# Patient Record
Sex: Female | Born: 1955 | Race: White | Hispanic: No | Marital: Married | Smoking: Never smoker
Health system: Southern US, Community
[De-identification: ages and names within clinical notes are randomized; demographics above are authoritative.]

## PROBLEM LIST (undated history)

## (undated) DIAGNOSIS — K219 Gastro-esophageal reflux disease without esophagitis: Secondary | ICD-10-CM

## (undated) DIAGNOSIS — F329 Major depressive disorder, single episode, unspecified: Secondary | ICD-10-CM

## (undated) DIAGNOSIS — S060X9A Concussion with loss of consciousness of unspecified duration, initial encounter: Secondary | ICD-10-CM

## (undated) DIAGNOSIS — S060XAA Concussion with loss of consciousness status unknown, initial encounter: Secondary | ICD-10-CM

## (undated) DIAGNOSIS — R011 Cardiac murmur, unspecified: Secondary | ICD-10-CM

## (undated) DIAGNOSIS — M1990 Unspecified osteoarthritis, unspecified site: Secondary | ICD-10-CM

## (undated) DIAGNOSIS — F32A Depression, unspecified: Secondary | ICD-10-CM

## (undated) DIAGNOSIS — E7800 Pure hypercholesterolemia, unspecified: Secondary | ICD-10-CM

## (undated) DIAGNOSIS — N809 Endometriosis, unspecified: Secondary | ICD-10-CM

## (undated) HISTORY — PX: THUMB AMPUTATION: SHX804

## (undated) HISTORY — PX: KNEE ARTHROPLASTY: SHX992

## (undated) HISTORY — PX: OTHER SURGICAL HISTORY: SHX169

## (undated) HISTORY — PX: DIAGNOSTIC LAPAROSCOPY: SUR761

## (undated) HISTORY — PX: TONSILLECTOMY: SUR1361

## (undated) HISTORY — DX: Depression, unspecified: F32.A

## (undated) HISTORY — PX: ABDOMINAL HYSTERECTOMY: SHX81

## (undated) HISTORY — PX: BUNIONECTOMY: SHX129

## (undated) HISTORY — DX: Gastro-esophageal reflux disease without esophagitis: K21.9

## (undated) HISTORY — DX: Pure hypercholesterolemia, unspecified: E78.00

## (undated) HISTORY — DX: Major depressive disorder, single episode, unspecified: F32.9

---

## 2008-08-13 ENCOUNTER — Emergency Department (HOSPITAL_COMMUNITY): Admission: EM | Admit: 2008-08-13 | Discharge: 2008-08-13 | Payer: Self-pay | Admitting: Emergency Medicine

## 2008-08-14 ENCOUNTER — Ambulatory Visit (HOSPITAL_BASED_OUTPATIENT_CLINIC_OR_DEPARTMENT_OTHER): Admission: RE | Admit: 2008-08-14 | Discharge: 2008-08-15 | Payer: Self-pay | Admitting: *Deleted

## 2008-10-23 ENCOUNTER — Encounter: Payer: Self-pay | Admitting: Cardiology

## 2009-06-15 ENCOUNTER — Emergency Department (HOSPITAL_COMMUNITY): Admission: EM | Admit: 2009-06-15 | Discharge: 2009-06-15 | Payer: Self-pay | Admitting: Emergency Medicine

## 2010-06-08 ENCOUNTER — Emergency Department (HOSPITAL_COMMUNITY): Admission: EM | Admit: 2010-06-08 | Discharge: 2010-06-08 | Payer: Self-pay | Admitting: Emergency Medicine

## 2011-01-28 NOTE — Op Note (Signed)
NAMEBRAYLEY, Renee Klein                  ACCOUNT NO.:  [id number]   MEDICAL RECORD NO.:  [id number]          PATIENT TYPE:  AMB   LOCATION:  DSC                          FACILITY:  [facility]   PHYSICIAN:  Lowell Bouton, M.D.DATE OF BIRTH:  [birth year]   DATE OF PROCEDURE:  08/14/2008  DATE OF DISCHARGE:                               OPERATIVE REPORT   PREOPERATIVE DIAGNOSIS:  Osteomyelitis distal phalanx left thumb.   POSTOPERATIVE DIAGNOSIS:  Osteomyelitis distal phalanx left thumb.   PROCEDURE:  Incision and drainage left thumb with repair of nail bed.   SURGEON:  Lowell Bouton, MD   ANESTHESIA:  General.   OPERATIVE FINDINGS:  The patient had gross purulent material under the  nail that had burst through the nail bed from the distal phalanx.  The  distal phalanx had eroded in the distal third and there was foul  smelling material in the pulp.   DESCRIPTION OF PROCEDURE:  Under general anesthesia with a tourniquet on  the left arm, the left hand was prepped and draped in the usual fashion  and after elevating the limb, the tourniquet was inflated to 250 mmHg.  A Freer elevator was used to remove the nail plate and gross purulent  material was obtained.  It appeared that the purulence had pushed  through the nail bed.  Cultures were sent to the lab.  The area that it  had pushed through the nail bed was spread open and more gross pus was  obtained.  A 15 blade was used to extend from the nail bed to the pulp  longitudinally and purulent material was debrided.  The rongeur and  curette was used to curette the distal phalanx which had a large hole in  it.  The wound was then copiously irrigated with saline using a 20-mL  syringe and 18-gauge needle.  The nail bed was repaired with a 5-0  chromic.  The pulp tissue was left open and packed with iodoform  packing.  Sterile dressings were applied.  Marcaine 0.50% digital block  was inserted for pain control.  The  tourniquet was released and the  patient went to the recovery room awake in stable in good condition.      Lowell Bouton, M.D.  Electronically Signed     EMM/MEDQ  D:  08/14/2008  T:  08/15/2008  Job:  [id number]

## 2011-06-17 LAB — ANAEROBIC CULTURE

## 2011-06-17 LAB — FUNGUS CULTURE W SMEAR

## 2011-06-17 LAB — AFB CULTURE WITH SMEAR (NOT AT ARMC)

## 2011-06-17 LAB — WOUND CULTURE: Gram Stain: NONE SEEN

## 2012-07-15 ENCOUNTER — Emergency Department (HOSPITAL_COMMUNITY)
Admission: EM | Admit: 2012-07-15 | Discharge: 2012-07-15 | Disposition: A | Discharge status: Home / Self Care | Payer: BC Managed Care – PPO | Attending: Emergency Medicine | Admitting: Emergency Medicine

## 2012-07-15 ENCOUNTER — Encounter (HOSPITAL_COMMUNITY): Payer: Self-pay

## 2012-07-15 ENCOUNTER — Emergency Department (HOSPITAL_COMMUNITY): Payer: BC Managed Care – PPO

## 2012-07-15 DIAGNOSIS — E7800 Pure hypercholesterolemia, unspecified: Secondary | ICD-10-CM | POA: Insufficient documentation

## 2012-07-15 DIAGNOSIS — Z90710 Acquired absence of both cervix and uterus: Secondary | ICD-10-CM | POA: Insufficient documentation

## 2012-07-15 DIAGNOSIS — F3289 Other specified depressive episodes: Secondary | ICD-10-CM | POA: Insufficient documentation

## 2012-07-15 DIAGNOSIS — R1031 Right lower quadrant pain: Secondary | ICD-10-CM | POA: Insufficient documentation

## 2012-07-15 DIAGNOSIS — K219 Gastro-esophageal reflux disease without esophagitis: Secondary | ICD-10-CM | POA: Insufficient documentation

## 2012-07-15 DIAGNOSIS — J45909 Unspecified asthma, uncomplicated: Secondary | ICD-10-CM | POA: Insufficient documentation

## 2012-07-15 DIAGNOSIS — F329 Major depressive disorder, single episode, unspecified: Secondary | ICD-10-CM | POA: Insufficient documentation

## 2012-07-15 DIAGNOSIS — Z8742 Personal history of other diseases of the female genital tract: Secondary | ICD-10-CM | POA: Insufficient documentation

## 2012-07-15 DIAGNOSIS — R109 Unspecified abdominal pain: Secondary | ICD-10-CM

## 2012-07-15 DIAGNOSIS — Z79899 Other long term (current) drug therapy: Secondary | ICD-10-CM | POA: Insufficient documentation

## 2012-07-15 LAB — URINALYSIS, ROUTINE W REFLEX MICROSCOPIC
Bilirubin Urine: NEGATIVE
Ketones, ur: NEGATIVE mg/dL
Leukocytes, UA: NEGATIVE
Nitrite: NEGATIVE
Protein, ur: NEGATIVE mg/dL
Urobilinogen, UA: 0.2 mg/dL (ref 0.0–1.0)
pH: 7.5 (ref 5.0–8.0)

## 2012-07-15 LAB — CBC WITH DIFFERENTIAL/PLATELET
Eosinophils Absolute: 0.3 10*3/uL (ref 0.0–0.7)
Lymphocytes Relative: 31 % (ref 12–46)
MCHC: 35.2 g/dL (ref 30.0–36.0)
Monocytes Absolute: 0.6 10*3/uL (ref 0.1–1.0)
Neutro Abs: 4 10*3/uL (ref 1.7–7.7)
Neutrophils Relative %: 57 % (ref 43–77)
RBC: 4.73 MIL/uL (ref 3.87–5.11)
WBC: 7 10*3/uL (ref 4.0–10.5)

## 2012-07-15 LAB — BASIC METABOLIC PANEL
BUN: 13 mg/dL (ref 6–23)
Chloride: 102 mEq/L (ref 96–112)
Glucose, Bld: 92 mg/dL (ref 70–99)
Sodium: 139 mEq/L (ref 135–145)

## 2012-07-15 MED ORDER — IOHEXOL 300 MG/ML  SOLN: 20.0000 mL | INTRAMUSCULAR | Status: DC

## 2012-07-15 MED ORDER — NAPROXEN 500 MG PO TABS: 500.0000 mg | ORAL_TABLET | Freq: Two times a day (BID) | ORAL | Status: DC

## 2012-07-15 MED ORDER — IOHEXOL 300 MG/ML  SOLN
100.0000 mL | Freq: Once | INTRAMUSCULAR | Status: AC | PRN
  Administered 2012-07-15: 100 mL via INTRAVENOUS

## 2012-07-15 NOTE — ED Notes (Signed)
Patient presents with RLQ abdominal pain since this past Monday with radiation around to her right flank.  Patient denies urinary symptoms of dysuria, hematuria, frequency or urgency. Patient has no rebound tenderness upon palpation.  Last bowel movement today.

## 2012-07-15 NOTE — ED Notes (Signed)
Pt denies sob, dizziness

## 2012-07-15 NOTE — ED Notes (Signed)
Called CT to inform that patient is finished with contrast

## 2012-07-15 NOTE — ED Notes (Signed)
Pt is back in room from CT

## 2012-07-15 NOTE — ED Provider Notes (Signed)
History     CSN: [id number]  Arrival date & time 07/15/12  1884   First MD Initiated Contact with Patient 07/15/12 1907      Chief Complaint  Patient presents with  . Abdominal Pain    (Consider location/radiation/quality/duration/timing/severity/associated sxs/prior treatment) HPI Comments: This is a 56 year old female, who presents to the emergency department with chief complaint of right lower quadrant abdominal pain x3 days. The patient has had an abdominal hysterectomy, as well as two laparoscopic procedures for endometriosis. There are no peritoneal signs, negative Murphy's sign or right upper quadrant tenderness. Patient endorses pain in the right lower quadrant with palpation of left lower quadrant. She also endorses pain and rebound tenderness at McBurney's point. She is in 6/10 pain. The patient refuses pain medication at this time. The pain is associated with nausea. Denies chest pain, shortness of breath, vomiting, or diarrhea, and constipation. Also denies vaginal discharge, and it dysuria.  The history is provided by the patient. No language interpreter was used.    Past Medical History  Diagnosis Date  . GERD (gastroesophageal reflux disease)   . Asthma   . Depression   . Hypercholesterolemia     Past Surgical History  Procedure Date  . Bone scraping left thumb   . Hysterotomy     Family History  Problem Relation Age of Onset  . Heart attack Mother   . Heart attack Father   . Other Father     heart valve surgery  . Seizures Sister   . Breast cancer Sister   . Hypertension Sister     History  Substance Use Topics  . Smoking status: Never Smoker   . Smokeless tobacco: Not on file  . Alcohol Use: 0.0 - 0.5 oz/week    0-1 drink(s) per week     rarely drinks alcohol    OB History    Grav Para Term Preterm Abortions TAB SAB Ect Mult Living                  Review of Systems  Constitutional: Negative for fever.  Cardiovascular: Negative for chest  pain.  Gastrointestinal: Positive for nausea and abdominal pain. Negative for vomiting, diarrhea and constipation.  Genitourinary: Negative for dysuria and vaginal discharge.  All other systems reviewed and are negative.    Allergies  Codeine; Levofloxacin; Lipitor; and Statins  Home Medications   Current Outpatient Rx  Name Route Sig Dispense Refill  . ALBUTEROL SULFATE HFA 108 (90 BASE) MCG/ACT IN AERS Inhalation Inhale 2 puffs into the lungs every 6 (six) hours as needed. For shortness of breath    . ALVESCO IN Inhalation Inhale 2 Inhalers into the lungs every evening.     Marland Kitchen MONTELUKAST SODIUM 5 MG PO CHEW Oral Chew 5 mg by mouth at bedtime.    Marland Kitchen PANTOPRAZOLE SODIUM 40 MG PO TBEC Oral Take 40 mg by mouth every evening.     Marland Kitchen PAROXETINE HCL 20 MG PO TABS Oral Take 20 mg by mouth every evening.       BP 145/80  Pulse 77  Temp 97.9 F (36.6 C) (Oral)  Resp 18  SpO2 95%  Physical Exam  Nursing note and vitals reviewed. Constitutional: She is oriented to person, place, and time. She appears well-developed and well-nourished.  HENT:  Head: Normocephalic and atraumatic.  Eyes: Conjunctivae normal and EOM are normal. Pupils are equal, round, and reactive to light.  Neck: Normal range of motion. Neck supple.  Cardiovascular: Normal rate,  regular rhythm and normal heart sounds.   Pulmonary/Chest: Effort normal and breath sounds normal.  Abdominal:       Positive McBurney point tenderness, positive Rovsing sign, no right upper quadrant or left upper quadrant tenderness. The abdomen is soft and nondistended.  Musculoskeletal: Normal range of motion.  Neurological: She is alert and oriented to person, place, and time.  Skin: Skin is warm and dry.  Psychiatric: She has a normal mood and affect. Her behavior is normal. Judgment and thought content normal.    ED Course  Procedures (including critical care time)   Labs Reviewed  URINALYSIS, ROUTINE W REFLEX MICROSCOPIC  CBC WITH  DIFFERENTIAL  BASIC METABOLIC PANEL   No results found.   No diagnosis found.    MDM   This is a 56 year old female with right lower quadrant abdominal pain. I'm going to this patient to the CDU, to await her CT. Have discussed this patient with Felicie Morn, NP, who will resume care at this time. Her disposition is pending CT results, suspicious of appendicitis.       Roxy Horseman, PA-C 07/15/12 2114

## 2012-07-15 NOTE — ED Notes (Signed)
Patient transported to CT 

## 2012-07-15 NOTE — ED Provider Notes (Signed)
Patient in CDU pending completion of diagnostic testing in the evaluation of abdominal pain.  Lab and CT results reviewed, discussed with Dr. Bebe Shaggy and shared with patient.  Patient reports her pain began Monday evening after spending the afternoon moving furniture and pulling up carpet at her house.  She does not recall suffering a known injury.  Her RLQ discomfort worsens with activity such as walking as well as bending lifting her right leg.  Pain eases off during rest.  Suspect discomfort may be muscular in origin.  Will trial anti-inflammatory at home.  If no improvement or continued symptoms, patient to follow-up with her care provider.  Abdominal pain discharge instructions and precautions provided.  Jimmye Norman, NP 07/15/12 2312

## 2012-07-16 NOTE — ED Provider Notes (Signed)
Medical screening examination/treatment/procedure(s) were conducted as a shared visit with non-physician practitioner(s) and myself.  I personally evaluated the patient during the encounter  Pt with abd tenderness, recommended CT imaging.  Pt stable in the ED  Joya Gaskins, MD 07/16/12 1510

## 2012-07-16 NOTE — ED Provider Notes (Signed)
Medical screening examination/treatment/procedure(s) were conducted as a shared visit with non-physician practitioner(s) and myself.  I personally evaluated the patient during the encounter   Joya Gaskins, MD 07/16/12 [phone]

## 2014-02-21 ENCOUNTER — Other Ambulatory Visit: Payer: Self-pay | Admitting: Orthopaedic Surgery

## 2014-02-21 DIAGNOSIS — M25562 Pain in left knee: Secondary | ICD-10-CM

## 2014-02-23 ENCOUNTER — Ambulatory Visit
Admission: RE | Admit: 2014-02-23 | Discharge: 2014-02-23 | Disposition: A | Discharge status: Home / Self Care | Payer: BC Managed Care – PPO | Source: Ambulatory Visit | Attending: Orthopaedic Surgery | Admitting: Orthopaedic Surgery

## 2014-02-23 DIAGNOSIS — M25562 Pain in left knee: Secondary | ICD-10-CM

## 2016-10-09 ENCOUNTER — Telehealth (INDEPENDENT_AMBULATORY_CARE_PROVIDER_SITE_OTHER): Payer: Self-pay | Admitting: *Deleted

## 2016-10-09 NOTE — Telephone Encounter (Signed)
Pt called asking if she could get Synvisc injection. CB:[phone]

## 2016-10-09 NOTE — Telephone Encounter (Signed)
[place]'t [place], can you do me a favor and see if and what she has?

## 2016-10-14 NOTE — Telephone Encounter (Signed)
I called pt back to get insurance info. Left message to call back

## 2016-10-17 ENCOUNTER — Other Ambulatory Visit (INDEPENDENT_AMBULATORY_CARE_PROVIDER_SITE_OTHER): Payer: Self-pay

## 2016-10-21 NOTE — Telephone Encounter (Signed)
Her Monovisc was approved, can you call her to make appt for this please?

## 2016-10-22 NOTE — Telephone Encounter (Signed)
Patient scheduled for an appointment on 09/22/16 for injection

## 2016-10-23 ENCOUNTER — Ambulatory Visit (INDEPENDENT_AMBULATORY_CARE_PROVIDER_SITE_OTHER): Payer: Managed Care, Other (non HMO) | Admitting: Physician Assistant

## 2016-10-23 DIAGNOSIS — M1712 Unilateral primary osteoarthritis, left knee: Secondary | ICD-10-CM

## 2016-10-23 DIAGNOSIS — M79642 Pain in left hand: Secondary | ICD-10-CM

## 2016-10-23 DIAGNOSIS — M79641 Pain in right hand: Secondary | ICD-10-CM | POA: Diagnosis not present

## 2016-10-23 MED ORDER — HYALURONAN 88 MG/4ML IX SOSY
88.0000 mg | PREFILLED_SYRINGE | INTRA_ARTICULAR | Status: AC | PRN
  Administered 2016-10-23: 88 mg via INTRA_ARTICULAR

## 2016-10-23 NOTE — Progress Notes (Signed)
Office Visit Note   Patient: Renee Klein           Date of Birth: 07/19/1956           MRN: [id number] Visit Date: 10/23/2016              Requested by: Barbette Hair, MD [address] [place] [address] PCP: Elise Benne, MD   Assessment & Plan: Visit Diagnoses: No diagnosis found.  Plan: We'll have her undergo EMG nerve conduction studies to rule out carpal tunnel syndrome bilateral hands have her follow with Dr. Magnus Ivan after the studies to discuss further treatment. Regards to her left knee she understands she can not have another Monovisc injection for another 6 months.  Follow-Up Instructions: Return for after EMG/ [place] studies with Dr. Magnus Ivan.   Orders:  No orders of the defined types were placed in this encounter.  No orders of the defined types were placed in this encounter.     Procedures: Large Joint Inj Date/Time: 10/23/2016 2:10 PM Performed by: Kirtland Bouchard Authorized by: Kirtland Bouchard   Location:  Knee Site:  L knee Needle Size:  22 G Needle Length:  1.5 inches Approach:  Anterolateral Ultrasound Guidance: No   Fluoroscopic Guidance: No   Arthrogram: No   Medications:  88 mg Hyaluronan 88 MG/4ML Aspiration Attempted: No   Patient tolerance:  Patient tolerated the procedure well with no immediate complications      Clinical Data: No additional findings.   Subjective: Chief Complaint  Patient presents with  . Left Knee - Follow-up    HPI Mrs. toe comes in today for left knee model disc injection. 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 No new injury to the knee. She does have a new complaint of bilateral hands going numb and pain in the hands she does have to shake her hands whenever she awakens. Also the hands go numb whenever she is driving. She does a lot of keying daily. Review of Systems   Objective: Vital Signs: There were no vitals taken for this visit.  Physical Exam    Constitutional: She is oriented to person, place, and time. She appears well-developed and well-nourished. No distress.  Pulmonary/Chest: Effort normal.  Neurological: She is alert and oriented to person, place, and time.  Psychiatric: She has a normal mood and affect. Her behavior is normal.    Ortho Exam Left knee she has good range of motion of the left knee today without pain. No rashes skin lesions ulcerations erythema or ecchymosis. Next bilateral hands she has full sensation to bilateral hands. Full motor of both hands. Radial pulses are 2+ bilaterally and equal symmetric. Positive compression test over the right median nerve. Negative Tinel's bilaterally over the median nerves. Specialty Comments:  No specialty comments available.  Imaging: No results found.   PMFS History: There are no active problems to display for this patient.  Past Medical History:  Diagnosis Date  . Asthma   . Depression   . GERD (gastroesophageal reflux disease)   . Hypercholesterolemia     Family History  Problem Relation Age of Onset  . Heart attack Mother   . Heart attack Father   . Other Father     heart valve surgery  . Seizures Sister   . Breast cancer Sister   . Hypertension Sister     Past Surgical History:  Procedure Laterality Date  . bone scraping left  thumb    . HYSTEROTOMY     Social History   Occupational History  . clinical researcher    Social History Main Topics  . Smoking status: Never Smoker  . Smokeless tobacco: Not on file  . Alcohol use 0.0 - 0.5 oz/week    0 - 1 drink(s) per week     Comment: rarely drinks alcohol  . Drug use: Unknown  . Sexual activity: Not on file

## 2016-10-31 ENCOUNTER — Encounter (INDEPENDENT_AMBULATORY_CARE_PROVIDER_SITE_OTHER): Payer: Self-pay | Admitting: Physical Medicine and Rehabilitation

## 2016-10-31 ENCOUNTER — Ambulatory Visit (INDEPENDENT_AMBULATORY_CARE_PROVIDER_SITE_OTHER): Payer: Managed Care, Other (non HMO) | Admitting: Physical Medicine and Rehabilitation

## 2016-10-31 DIAGNOSIS — R202 Paresthesia of skin: Secondary | ICD-10-CM | POA: Diagnosis not present

## 2016-10-31 DIAGNOSIS — R200 Anesthesia of skin: Secondary | ICD-10-CM

## 2016-10-31 NOTE — Progress Notes (Addendum)
Renee Klein - 61 y.o. female MRN [id number]  Date of birth: [birth year]  Office Visit Note: Visit Date: 10/31/2016 PCP: Elise Benne, MD Referred by: Elise Benne IV*  Subjective: Chief Complaint  Patient presents with  . Left Hand - Numbness, Pain  . Right Hand - Pain, Numbness   HPI: Renee Klein is a 61 year old right-hand dominant female who is followed by Dr. Magnus Ivan and Rexene Edison, PA with complaints of worsening chronic bilateral hand pain and numbness into fingers. She reports worsening symptoms on the right more than left. States when it gets really bad it will radiate up to elbow on both sides.She  Reports having  to shake her hands whenever she awakens. Also the hands go numb whenever she is driving. She does a lot of keying daily. She has not had prior electrodiagnostic studies.    ROS Otherwise per HPI.  Assessment & Plan: Visit Diagnoses:  1. Numbness and tingling     Plan: No additional findings.  Impression:  The above electrodiagnostic study is ABNORMAL and reveals evidence of a mild bilateral median nerve entrapment at the wrist (carpal tunnel syndrome) affecting sensory components.   There is no significant electrodiagnostic evidence of any other focal nerve entrapment, brachial plexopathy or cervical radiculopathy.    As you know, this particular electrodiagnostic study cannot rule out chemical radiculitis or sensory only radiculopathy.  Recommendations: 1.  Follow-up with referring physician. 2.  Continue current management of symptoms. 3.  Suggest/Continue use of resting splint at night-time and as needed during the day. Consider carpal tunnel injection.     Meds & Orders: No orders of the defined types were placed in this encounter.   Orders Placed This Encounter  Procedures  . NCV with EMG (electromyography)    Follow-up: Return for Scheduled follow-up with Rexene Edison, PA.   Procedures: No procedures performed  EMG & NCV  Findings: Evaluation of the left median (across palm) sensory and the right median (across palm) sensory nerves showed prolonged distal peak latency (Wrist, L59.0, R59.0 ms) and prolonged distal peak latency (Palm, L2.5, R2.8 ms).  All remaining nerves (as indicated in the following tables) were within normal limits.  Left vs. Right side comparison data for the ulnar motor nerve indicates abnormal L-R velocity difference (A Elbow-B Elbow, 20 m/s).  All remaining left vs. right side differences were within normal limits.    All examined muscles (as indicated in the following table) showed no evidence of electrical instability.    Impression: The above electrodiagnostic study is ABNORMAL and reveals evidence of a mild bilateral median nerve entrapment at the wrist (carpal tunnel syndrome) affecting sensory components.   There is no significant electrodiagnostic evidence of any other focal nerve entrapment, brachial plexopathy or cervical radiculopathy.  As you know, this particular electrodiagnostic study cannot rule out chemical radiculitis or sensory only radiculopathy.  Recommendations: 1.  Follow-up with referring physician. 2.  Continue current management of symptoms. 3.  Continue use of resting splint at night-time and as needed during the day. Consider carpal tunnel injection.  Nerve Conduction Studies Anti Sensory Summary Table   Stim Site NR Peak (ms) Norm Peak (ms) P-T Amp (V) Norm P-T Amp Site1 Site2 Delta-P (ms) Dist (cm) Vel (m/s) Norm Vel (m/s)  Left Median Acr Palm Anti Sensory (2nd Digit)  31.9C  Wrist    *3.9 <3.6 43.0 >10 Wrist Palm 1.4 0.0    Palm    *2.5 <2.0 2.5  Right Median Acr Palm Anti Sensory (2nd Digit)  32.1C  Wrist    *3.9 <3.6 21.1 >10 Wrist Palm 1.1 0.0    Palm    *2.8 <2.0 8.9         Left Radial Anti Sensory (Base 1st Digit)  31.7C  Wrist    2.1 <3.1 32.0  Wrist Base 1st Digit 2.1 0.0    Right Radial Anti Sensory (Base 1st Digit)  32.8C  Wrist     2.3 <3.1 22.4  Wrist Base 1st Digit 2.3 0.0    Left Ulnar Anti Sensory (5th Digit)  32C  Wrist    3.4 <3.7 22.4 >15.0 Wrist 5th Digit 3.4 14.0 41 >38  Right Ulnar Anti Sensory (5th Digit)  32.3C  Wrist    3.3 <3.7 30.2 >15.0 Wrist 5th Digit 3.3 14.0 42 >38   Motor Summary Table   Stim Site NR Onset (ms) Norm Onset (ms) O-P Amp (mV) Norm O-P Amp Site1 Site2 Delta-0 (ms) Dist (cm) Vel (m/s) Norm Vel (m/s)  Left Median Motor (Abd Poll Brev)  31.8C  Wrist    3.8 <4.2 7.2 >5 Elbow Wrist 3.6 18.5 51 >50  Elbow    7.4  2.5         Right Median Motor (Abd Poll Brev)  32.6C  Wrist    3.9 <4.2 7.6 >5 Elbow Wrist 3.7 19.0 51 >50  Elbow    7.6  2.8         Left Ulnar Motor (Abd Dig Min)  31.8C  Wrist    2.8 <4.2 11.8 >3 B Elbow Wrist 3.0 17.5 58 >53  B Elbow    5.8  11.6  A Elbow B Elbow 1.2 9.0 75 >53  A Elbow    7.0  8.8         Right Ulnar Motor (Abd Dig Min)  32.6C  Wrist    2.8 <4.2 11.6 >3 B Elbow Wrist 2.8 18.0 64 >53  B Elbow    5.6  11.9  A Elbow B Elbow 1.0 9.5 95 >53  A Elbow    6.6  7.8          EMG   Side Muscle Nerve Root Ins Act Fibs Psw Amp Dur Poly Recrt Int Dennie Bible Comment  Right Abd Poll Brev Median C8-T1 Nml Nml Nml Nml Nml 0 Nml Nml   Right 1stDorInt Ulnar C8-T1 Nml Nml Nml Nml Nml 0 Nml Nml   Right PronatorTeres Median C6-7 Nml Nml Nml Nml Nml 0 Nml Nml     Nerve Conduction Studies Anti Sensory Left/Right Comparison   Stim Site L Lat (ms) R Lat (ms) L-R Lat (ms) L Amp (V) R Amp (V) L-R Amp (%) Site1 Site2 L Vel (m/s) R Vel (m/s) L-R Vel (m/s)  Median Acr Palm Anti Sensory (2nd Digit)  31.9C  Wrist *3.9 *3.9 0.0 43.0 21.1 50.9 Wrist Palm     Palm *2.5 *2.8 0.3 2.5 8.9 71.9       Radial Anti Sensory (Base 1st Digit)  31.7C  Wrist 2.1 2.3 0.2 32.0 22.4 30.0 Wrist Base 1st Digit     Ulnar Anti Sensory (5th Digit)  32C  Wrist 3.4 3.3 0.1 22.4 30.2 25.8 Wrist 5th Digit 41 42 1   Motor Left/Right Comparison   Stim Site L Lat (ms) R Lat (ms) L-R Lat (ms) L Amp  (mV) R Amp (mV) L-R Amp (%) Site1 Site2 L Vel (m/s) R Vel (m/s) L-R Vel (  m/s)  Median Motor (Abd Poll Brev)  31.8C  Wrist 3.8 3.9 0.1 7.2 7.6 5.3 Elbow Wrist 51 51 0  Elbow 7.4 7.6 0.2 2.5 2.8 10.7       Ulnar Motor (Abd Dig Min)  31.8C  Wrist 2.8 2.8 0.0 11.8 11.6 1.7 B Elbow Wrist 58 64 6  B Elbow 5.8 5.6 0.2 11.6 11.9 2.5 A Elbow B Elbow 75 95 *20  A Elbow 7.0 6.6 0.4 8.8 7.8 11.4             Clinical History: No specialty comments available.  She reports that she has never smoked. She does not have any smokeless tobacco history on file. No results for input(s): HGBA1C, LABURIC in the last 8760 hours.  Objective:  VS:  HT:    WT:   BMI:     BP:   HR: bpm  TEMP: ( )  RESP:  Physical Exam  Ortho Exam Imaging: No results found.  Past Medical/Family/Surgical/Social History: Medications & Allergies reviewed per EMR There are no active problems to display for this patient.  Past Medical History:  Diagnosis Date  . Asthma   . Depression   . GERD (gastroesophageal reflux disease)   . Hypercholesterolemia    Family History  Problem Relation Age of Onset  . Heart attack Mother   . Heart attack Father   . Other Father     heart valve surgery  . Seizures Sister   . Breast cancer Sister   . Hypertension Sister    Past Surgical History:  Procedure Laterality Date  . bone scraping left thumb    . HYSTEROTOMY     Social History   Occupational History  . clinical researcher    Social History Main Topics  . Smoking status: Never Smoker  . Smokeless tobacco: Not on file  . Alcohol use 0.0 - 0.5 oz/week    0 - 1 drink(s) per week     Comment: rarely drinks alcohol  . Drug use: Unknown  . Sexual activity: Not on file

## 2016-11-03 NOTE — Procedures (Signed)
EMG & NCV Findings: Evaluation of the left median (across palm) sensory and the right median (across palm) sensory nerves showed prolonged distal peak latency (Wrist, L3.9, R3.9 ms) and prolonged distal peak latency (Palm, L2.5, R2.8 ms).  All remaining nerves (as indicated in the following tables) were within normal limits.  Left vs. Right side comparison data for the ulnar motor nerve indicates abnormal L-R velocity difference (A Elbow-B Elbow, 20 m/s).  All remaining left vs. right side differences were within normal limits.    All examined muscles (as indicated in the following table) showed no evidence of electrical instability.    Impression: The above electrodiagnostic study is ABNORMAL and reveals evidence of a mild bilateral median nerve entrapment at the wrist (carpal tunnel syndrome) affecting sensory components.   There is no significant electrodiagnostic evidence of any other focal nerve entrapment, brachial plexopathy or cervical radiculopathy.  As you know, this particular electrodiagnostic study cannot rule out chemical radiculitis or sensory only radiculopathy.  Recommendations: 1.  Follow-up with referring physician. 2.  Continue current management of symptoms. 3.  Continue use of resting splint at night-time and as needed during the day. Consider carpal tunnel injection.  Nerve Conduction Studies Anti Sensory Summary Table   Stim Site NR Peak (ms) Norm Peak (ms) P-T Amp (V) Norm P-T Amp Site1 Site2 Delta-P (ms) Dist (cm) Vel (m/s) Norm Vel (m/s)  Left Median Acr Palm Anti Sensory (2nd Digit)  31.9C  Wrist    *3.9 <3.6 43.0 >10 Wrist Palm 1.4 0.0    Palm    *2.5 <2.0 2.5         Right Median Acr Palm Anti Sensory (2nd Digit)  32.1C  Wrist    *3.9 <3.6 21.1 >10 Wrist Palm 1.1 0.0    Palm    *2.8 <2.0 8.9         Left Radial Anti Sensory (Base 1st Digit)  31.7C  Wrist    2.1 <3.1 32.0  Wrist Base 1st Digit 2.1 0.0    Right Radial Anti Sensory (Base 1st Digit)  32.8C   Wrist    2.3 <3.1 22.4  Wrist Base 1st Digit 2.3 0.0    Left Ulnar Anti Sensory (5th Digit)  32C  Wrist    3.4 <3.7 22.4 >15.0 Wrist 5th Digit 3.4 14.0 41 >38  Right Ulnar Anti Sensory (5th Digit)  32.3C  Wrist    3.3 <3.7 30.2 >15.0 Wrist 5th Digit 3.3 14.0 42 >38   Motor Summary Table   Stim Site NR Onset (ms) Norm Onset (ms) O-P Amp (mV) Norm O-P Amp Site1 Site2 Delta-0 (ms) Dist (cm) Vel (m/s) Norm Vel (m/s)  Left Median Motor (Abd Poll Brev)  31.8C  Wrist    3.8 <4.2 7.2 >5 Elbow Wrist 3.6 18.5 51 >50  Elbow    7.4  2.5         Right Median Motor (Abd Poll Brev)  32.6C  Wrist    3.9 <4.2 7.6 >5 Elbow Wrist 3.7 19.0 51 >50  Elbow    7.6  2.8         Left Ulnar Motor (Abd Dig Min)  31.8C  Wrist    2.8 <4.2 11.8 >3 B Elbow Wrist 3.0 17.5 58 >53  B Elbow    5.8  11.6  A Elbow B Elbow 1.2 9.0 75 >53  A Elbow    7.0  8.8         Right Ulnar Motor (Abd Dig  Min)  32.6C  Wrist    2.8 <4.2 11.6 >3 B Elbow Wrist 2.8 18.0 64 >53  B Elbow    5.6  11.9  A Elbow B Elbow 1.0 9.5 95 >53  A Elbow    6.6  7.8          EMG   Side Muscle Nerve Root Ins Act Fibs Psw Amp Dur Poly Recrt Int Dennie BiblePat Comment  Right Abd Poll Brev Median C8-T1 Nml Nml Nml Nml Nml 0 Nml Nml   Right 1stDorInt Ulnar C8-T1 Nml Nml Nml Nml Nml 0 Nml Nml   Right PronatorTeres Median C6-7 Nml Nml Nml Nml Nml 0 Nml Nml     Nerve Conduction Studies Anti Sensory Left/Right Comparison   Stim Site L Lat (ms) R Lat (ms) L-R Lat (ms) L Amp (V) R Amp (V) L-R Amp (%) Site1 Site2 L Vel (m/s) R Vel (m/s) L-R Vel (m/s)  Median Acr Palm Anti Sensory (2nd Digit)  31.9C  Wrist *3.9 *3.9 0.0 43.0 21.1 50.9 Wrist Palm     Palm *2.5 *2.8 0.3 2.5 8.9 71.9       Radial Anti Sensory (Base 1st Digit)  31.7C  Wrist 2.1 2.3 0.2 32.0 22.4 30.0 Wrist Base 1st Digit     Ulnar Anti Sensory (5th Digit)  32C  Wrist 3.4 3.3 0.1 22.4 30.2 25.8 Wrist 5th Digit 41 42 1   Motor Left/Right Comparison   Stim Site L Lat (ms) R Lat (ms) L-R Lat  (ms) L Amp (mV) R Amp (mV) L-R Amp (%) Site1 Site2 L Vel (m/s) R Vel (m/s) L-R Vel (m/s)  Median Motor (Abd Poll Brev)  31.8C  Wrist 3.8 3.9 0.1 7.2 7.6 5.3 Elbow Wrist 51 51 0  Elbow 7.4 7.6 0.2 2.5 2.8 10.7       Ulnar Motor (Abd Dig Min)  31.8C  Wrist 2.8 2.8 0.0 11.8 11.6 1.7 B Elbow Wrist 58 64 6  B Elbow 5.8 5.6 0.2 11.6 11.9 2.5 A Elbow B Elbow 75 95 *20  A Elbow 7.0 6.6 0.4 8.8 7.8 11.4

## 2016-11-10 ENCOUNTER — Ambulatory Visit (INDEPENDENT_AMBULATORY_CARE_PROVIDER_SITE_OTHER): Payer: Managed Care, Other (non HMO) | Admitting: Physician Assistant

## 2016-11-17 ENCOUNTER — Encounter (INDEPENDENT_AMBULATORY_CARE_PROVIDER_SITE_OTHER): Payer: Self-pay | Admitting: Physician Assistant

## 2016-11-17 ENCOUNTER — Ambulatory Visit (INDEPENDENT_AMBULATORY_CARE_PROVIDER_SITE_OTHER): Payer: Managed Care, Other (non HMO) | Admitting: Physician Assistant

## 2016-11-17 DIAGNOSIS — G5602 Carpal tunnel syndrome, left upper limb: Secondary | ICD-10-CM | POA: Diagnosis not present

## 2016-11-17 DIAGNOSIS — G5601 Carpal tunnel syndrome, right upper limb: Secondary | ICD-10-CM | POA: Diagnosis not present

## 2016-11-17 MED ORDER — METHYLPREDNISOLONE ACETATE 40 MG/ML IJ SUSP
40.0000 mg | INTRAMUSCULAR | Status: AC | PRN
  Administered 2016-11-17: 40 mg

## 2016-11-17 MED ORDER — LIDOCAINE HCL 1 % IJ SOLN
1.0000 mL | INTRAMUSCULAR | Status: AC | PRN
  Administered 2016-11-17: 1 mL

## 2016-11-17 NOTE — Progress Notes (Signed)
Office Visit Note   Patient: Renee Klein           Date of Birth: 01/13/1956           MRN: [id number] Visit Date: 11/17/2016              Requested by: Barbette Hair, MD [address], [place] [address] PCP: Elise Benne, MD   Assessment & Plan: Visit Diagnoses:  1. Carpal tunnel syndrome, left upper limb   2. Carpal tunnel syndrome, right upper limb     Plan: She will try vitamin B6 on her milligrams twice daily. 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 We'll see her back in a month check progress lack of. Questions encouraged and answered.  Follow-Up Instructions: Return in about 4 weeks (around 12/15/2016).   Orders:  Orders Placed This Encounter  Procedures  . Hand/Upper Extremity Injection/Arthrocentesis   No orders of the defined types were placed in this encounter.     Procedures: Hand/UE Inj Date/Time: 11/17/2016 9:42 AM Performed by: Kirtland Bouchard Authorized by: Kirtland Bouchard   Consent Given by:  Patient Indications:  Pain and therapeutic Condition: carpal tunnel   Needle Size:  25 G Approach:  Volar Ultrasound Guidance: No   Medications:  1 mL lidocaine 1 %; 40 mg methylPREDNISolone acetate 40 MG/ML Patient tolerance:  Patient tolerated the procedure well with no immediate complications     Clinical Data: No additional findings.   Subjective: Chief Complaint  Patient presents with  . Right Hand - Pain  . Left Hand - Pain    Patient returns to review EMG/NCV of bilateral upper extremities. She states that her symptoms are a little worse. She states that she did fall yesterday over a shoe string that became untied. She is taking aleve which helps with her pain when she doubles up on them.   She states that her right wrist is more symptomatic than the left. Has a lot of symptoms whenever she is working at the [employer].  She's had no real treatment for this numbness tingling in the hands. EMG study findings are reviewed with the patient. EMG'show abnormal study with significant electrodiagnostic evidence of bilateral median nerve entrapment which is mild.No focal nerve entrapment ,brachial plexopathy or cervical radiculopathy.  Review of Systems   Objective: Vital Signs: There were no vitals taken for this visit.  Physical Exam  Constitutional: She appears well-developed and well-nourished. No distress.    Ortho Exam Tenderness over the right wrist median nerve. Tenderness over the A1 pulleys at both middle fingers no active triggering. Radial pulses are intact bilaterally. No rashes skin lesions ulcerations bilateral hands. Specialty Comments:  No specialty comments available.  Imaging: No results found.   PMFS History: There are no active problems to display for this patient.  Past Medical History:  Diagnosis Date  . Asthma   . Depression   . GERD (gastroesophageal reflux disease)   . Hypercholesterolemia     Family History  Problem Relation Age of Onset  . Heart attack Mother   . Heart attack Father   . Other Father     heart valve surgery  . Seizures Sister   . Breast cancer Sister   . Hypertension Sister     Past Surgical History:  Procedure Laterality Date  . bone scraping left thumb    .  HYSTEROTOMY     Social History   Occupational History  . clinical researcher    Social History Main Topics  . Smoking status: Never Smoker  . Smokeless tobacco: Never Used  . Alcohol use 0.0 - 0.5 oz/week     Comment: rarely drinks alcohol  . Drug use: Unknown  . Sexual activity: Not on file

## 2016-12-24 ENCOUNTER — Ambulatory Visit (INDEPENDENT_AMBULATORY_CARE_PROVIDER_SITE_OTHER): Payer: Managed Care, Other (non HMO) | Admitting: Physician Assistant

## 2016-12-24 ENCOUNTER — Encounter (INDEPENDENT_AMBULATORY_CARE_PROVIDER_SITE_OTHER): Payer: Self-pay | Admitting: Physician Assistant

## 2016-12-24 DIAGNOSIS — G5603 Carpal tunnel syndrome, bilateral upper limbs: Secondary | ICD-10-CM

## 2016-12-24 NOTE — Progress Notes (Signed)
   Office Visit Note   Patient: Renee Klein           Date of Birth: 1956/02/04           MRN: [id number] Visit Date: 12/24/2016              Requested by: Barbette Hair, MD [address] [place] [address] PCP: Elise Benne, MD   Assessment & Plan: Visit Diagnoses:  1. Carpal tunnel syndrome on both sides     Plan: Continue vitamin B6. Continue the wrist splints. Follow up as needed  Follow-Up Instructions: Return in about 2 weeks (around 01/07/2017).   Orders:  No orders of the defined types were placed in this encounter.  No orders of the defined types were placed in this encounter.     Procedures: No procedures performed   Clinical Data: No additional findings.   Subjective: Chief Complaint  Patient presents with  . Right Wrist - Follow-up    Carpal Tunnel injection    HPI Scope returns today status post carpal tunnel injection on the right 11/17/2016. She states that this really helped. She's tried the bracing that if it seems to be helping. She also feels that is helped her snoring. She's having some numbness tingling in her left hand is requesting a wrist brace for this side. She is trying some vitamin B 6 in the thinks this may also be helping. Review of Systems   Objective: Vital Signs: There were no vitals taken for this visit.  Physical Exam  Ortho Exam After wrist she has negative Tinel's over the median nerve. Negative compression test. Sensation intact throughout left hand. No rashes skin lesions ulcerations erythema. Radial pulses 2+. Sensation grossly intact throughout the hand Specialty Comments:  No specialty comments available.  Imaging: No results found.   PMFS History: There are no active problems to display for this patient.  Past Medical History:  Diagnosis Date  . Asthma   . Depression   . GERD (gastroesophageal reflux disease)   . Hypercholesterolemia     Family History  Problem Relation Age of  Onset  . Heart attack Mother   . Heart attack Father   . Other Father     heart valve surgery  . Seizures Sister   . Breast cancer Sister   . Hypertension Sister     Past Surgical History:  Procedure Laterality Date  . bone scraping left thumb    . HYSTEROTOMY     Social History   Occupational History  . clinical researcher    Social History Main Topics  . Smoking status: Never Smoker  . Smokeless tobacco: Never Used  . Alcohol use 0.0 - 0.5 oz/week     Comment: rarely drinks alcohol  . Drug use: Unknown  . Sexual activity: Not on file

## 2017-05-26 ENCOUNTER — Encounter: Payer: Self-pay | Admitting: General Surgery

## 2017-05-26 ENCOUNTER — Ambulatory Visit (INDEPENDENT_AMBULATORY_CARE_PROVIDER_SITE_OTHER): Payer: 59 | Admitting: General Surgery

## 2017-05-26 VITALS — BP 158/84 | HR 80 | Temp 97.5°F | Resp 18 | Ht 65.0 in | Wt 246.0 lb

## 2017-05-26 DIAGNOSIS — K8020 Calculus of gallbladder without cholecystitis without obstruction: Secondary | ICD-10-CM

## 2017-05-26 NOTE — Progress Notes (Signed)
Renee RanDebra Klein; [id number]; 12/07/1955   HPI Patient is a 61 year old white female who presented to the office for evaluation and treatment of cholelithiasis.  She has had 2 episodes of biliary colic, [place] [facility] [place] [facility]  She was found on ultrasound of the gallbladder and cholelithiasis.  Her common bile duct was within normal limits.  She states that this is her second episode, but she is now having intermittent pain in the right upper quadrant.  She does have nausea but no vomiting.  No fever, chills, or jaundice noted.  She does have some fatty food intolerance. Past Medical History:  Diagnosis Date  . Asthma   . Depression   . GERD (gastroesophageal reflux disease)   . Hypercholesterolemia     Past Surgical History:  Procedure Laterality Date  . bone scraping left thumb    . HYSTEROTOMY      Family History  Problem Relation Age of Onset  . Heart attack Mother   . Heart attack Father   . Other Father        heart valve surgery  . Seizures Sister   . Breast cancer Sister   . Hypertension Sister     Current Outpatient Prescriptions on File Prior to Visit  Medication Sig Dispense Refill  . albuterol (PROVENTIL HFA;VENTOLIN HFA) 108 (90 BASE) MCG/ACT inhaler Inhale 2 puffs into the lungs every 6 (six) hours as needed. For shortness of breath    . ARNUITY ELLIPTA 200 MCG/ACT AEPB Inhale 2 puffs into the lungs daily.     . fluticasone (FLONASE) 50 MCG/ACT nasal spray Place 1 spray into both nostrils daily as needed for allergies.     . montelukast (SINGULAIR) 10 MG tablet Take 10 mg by mouth daily.     . Multiple Vitamins-Minerals (WOMENS MULTIVITAMIN PO) Take 1 tablet by mouth daily.     . pantoprazole (PROTONIX) 40 MG tablet Take 40 mg by mouth every morning.     Marland Kitchen. PARoxetine (PAXIL) 20 MG tablet Take 20 mg by mouth every morning.      No current facility-administered medications on file prior to visit.     Allergies  Allergen Reactions  .  Codeine   . Levofloxacin   . Lipitor [Atorvastatin Calcium] Other (See Comments)    Muscle weakness  . Statins Other (See Comments)    Muscle weakness    History  Alcohol Use  . 0.0 - 0.5 oz/week    Comment: rarely drinks alcohol    History  Smoking Status  . Never Smoker  Smokeless Tobacco  . Never Used    Review of Systems  Constitutional: Positive for malaise/fatigue.  HENT: Negative.   Eyes: Negative.   Respiratory: Positive for shortness of breath and wheezing.   Cardiovascular: Negative.   Gastrointestinal: Positive for abdominal pain, heartburn and nausea.  Genitourinary: Negative.   Musculoskeletal: Positive for joint pain.  Skin: Negative.   Neurological: Negative.   Endo/Heme/Allergies: Negative.   Psychiatric/Behavioral: Negative.     Objective   Vitals:   05/26/17 1252  BP: (!) 158/84  Pulse: 80  Resp: 18  Temp: (!) 97.5 F (36.4 C)    Physical Exam  Constitutional: She is oriented to person, place, and time and well-developed, well-nourished, and in no distress.  HENT:  Head: Normocephalic and atraumatic.  Eyes: No scleral icterus.  Cardiovascular: Normal rate, regular rhythm and normal heart sounds.  Exam reveals no gallop and no friction rub.   No  murmur heard. Pulmonary/Chest: Effort normal and breath sounds normal. No respiratory distress. She has no wheezes. She has no rales.  Abdominal: Soft. Bowel sounds are normal. She exhibits no distension. There is tenderness. There is no rebound and no guarding.  Tender in the right upper quadrant to deep palpation.  No rigidity noted.  Neurological: She is alert and oriented to person, place, and time.  Skin: Skin is warm and dry.  Vitals reviewed.    ER notes from [place] reviewed Assessment   biliary colic, cholelithiasis Plan    patient scheduled for laparoscopic cholecystectomy on 06/01/2017.  The risks and benefits of the procedure including bleeding, infection, hepatobiliary injury,  the possibility of an open procedure were fully explained to the patient, who gave informed consent.  

## 2017-05-26 NOTE — Patient Instructions (Signed)

## 2017-05-26 NOTE — H&P (Signed)
Renee Klein; [id number]; 12/07/1955   HPI Patient is a 61 year old white female who presented to the office for evaluation and treatment of cholelithiasis.  She has had 2 episodes of biliary colic, [facility] [place] [facility].  She was found on ultrasound of the gallbladder and cholelithiasis.  Her common bile duct was within normal limits.  She states that this is her second episode, but she is now having intermittent pain in the right upper quadrant.  She does have nausea but no vomiting.  No fever, chills, or jaundice noted.  She does have some fatty food intolerance. Past Medical History:  Diagnosis Date  . Asthma   . Depression   . GERD (gastroesophageal reflux disease)   . Hypercholesterolemia     Past Surgical History:  Procedure Laterality Date  . bone scraping left thumb    . HYSTEROTOMY      Family History  Problem Relation Age of Onset  . Heart attack Mother   . Heart attack Father   . Other Father        heart valve surgery  . Seizures Sister   . Breast cancer Sister   . Hypertension Sister     Current Outpatient Prescriptions on File Prior to Visit  Medication Sig Dispense Refill  . albuterol (PROVENTIL HFA;VENTOLIN HFA) 108 (90 BASE) MCG/ACT inhaler Inhale 2 puffs into the lungs every 6 (six) hours as needed. For shortness of breath    . ARNUITY ELLIPTA 200 MCG/ACT AEPB Inhale 2 puffs into the lungs daily.     . fluticasone (FLONASE) 50 MCG/ACT nasal spray Place 1 spray into both nostrils daily as needed for allergies.     . montelukast (SINGULAIR) 10 MG tablet Take 10 mg by mouth daily.     . Multiple Vitamins-Minerals (WOMENS MULTIVITAMIN PO) Take 1 tablet by mouth daily.     . pantoprazole (PROTONIX) 40 MG tablet Take 40 mg by mouth every morning.     Marland Kitchen. PARoxetine (PAXIL) 20 MG tablet Take 20 mg by mouth every morning.      No current facility-administered medications on file prior to visit.     Allergies  Allergen Reactions  .  Codeine   . Levofloxacin   . Lipitor [Atorvastatin Calcium] Other (See Comments)    Muscle weakness  . Statins Other (See Comments)    Muscle weakness    History  Alcohol Use  . 0.0 - 0.5 oz/week    Comment: rarely drinks alcohol    History  Smoking Status  . Never Smoker  Smokeless Tobacco  . Never Used    Review of Systems  Constitutional: Positive for malaise/fatigue.  HENT: Negative.   Eyes: Negative.   Respiratory: Positive for shortness of breath and wheezing.   Cardiovascular: Negative.   Gastrointestinal: Positive for abdominal pain, heartburn and nausea.  Genitourinary: Negative.   Musculoskeletal: Positive for joint pain.  Skin: Negative.   Neurological: Negative.   Endo/Heme/Allergies: Negative.   Psychiatric/Behavioral: Negative.     Objective   Vitals:   05/26/17 1252  BP: (!) 158/84  Pulse: 80  Resp: 18  Temp: (!) 97.5 F (36.4 C)    Physical Exam  Constitutional: She is oriented to person, place, and time and well-developed, well-nourished, and in no distress.  HENT:  Head: Normocephalic and atraumatic.  Eyes: No scleral icterus.  Cardiovascular: Normal rate, regular rhythm and normal heart sounds.  Exam reveals no gallop and no friction rub.   No  murmur heard. Pulmonary/Chest: Effort normal and breath sounds normal. No respiratory distress. She has no wheezes. She has no rales.  Abdominal: Soft. Bowel sounds are normal. She exhibits no distension. There is tenderness. There is no rebound and no guarding.  Tender in the right upper quadrant to deep palpation.  No rigidity noted.  Neurological: She is alert and oriented to person, place, and time.  Skin: Skin is warm and dry.  Vitals reviewed.    ER notes from [facility] reviewed Assessment   biliary colic, cholelithiasis Plan    patient scheduled for laparoscopic cholecystectomy on 06/01/2017.  The risks and benefits of the procedure including bleeding, infection, hepatobiliary injury,  the possibility of an open procedure were fully explained to the patient, who gave informed consent.

## 2017-05-28 ENCOUNTER — Encounter (HOSPITAL_COMMUNITY): Payer: Self-pay

## 2017-05-28 ENCOUNTER — Encounter (HOSPITAL_COMMUNITY)
Admission: RE | Admit: 2017-05-28 | Discharge: 2017-05-28 | Disposition: A | Discharge status: Home / Self Care | Payer: 59 | Source: Ambulatory Visit | Attending: General Surgery | Admitting: General Surgery

## 2017-05-28 HISTORY — DX: Concussion with loss of consciousness status unknown, initial encounter: S06.0XAA

## 2017-05-28 HISTORY — DX: Concussion with loss of consciousness of unspecified duration, initial encounter: S06.0X9A

## 2017-05-28 HISTORY — DX: Unspecified osteoarthritis, unspecified site: M19.90

## 2017-05-28 HISTORY — DX: Cardiac murmur, unspecified: R01.1

## 2017-05-28 HISTORY — DX: Endometriosis, unspecified: N80.9

## 2017-06-01 ENCOUNTER — Encounter (HOSPITAL_COMMUNITY): Payer: Self-pay | Admitting: *Deleted

## 2017-06-01 ENCOUNTER — Ambulatory Visit (HOSPITAL_COMMUNITY): Payer: 59 | Admitting: Anesthesiology

## 2017-06-01 ENCOUNTER — Encounter (HOSPITAL_COMMUNITY)
Admission: RE | Disposition: A | Discharge status: Home / Self Care | Payer: Self-pay | Source: Ambulatory Visit | Attending: General Surgery

## 2017-06-01 ENCOUNTER — Ambulatory Visit (HOSPITAL_COMMUNITY)
Admission: RE | Admit: 2017-06-01 | Discharge: 2017-06-01 | Disposition: A | Discharge status: Home / Self Care | Payer: 59 | Source: Ambulatory Visit | Attending: General Surgery | Admitting: General Surgery

## 2017-06-01 DIAGNOSIS — Z820 Family history of epilepsy and other diseases of the nervous system: Secondary | ICD-10-CM | POA: Diagnosis not present

## 2017-06-01 DIAGNOSIS — J45909 Unspecified asthma, uncomplicated: Secondary | ICD-10-CM | POA: Insufficient documentation

## 2017-06-01 DIAGNOSIS — Z803 Family history of malignant neoplasm of breast: Secondary | ICD-10-CM | POA: Diagnosis not present

## 2017-06-01 DIAGNOSIS — Z8249 Family history of ischemic heart disease and other diseases of the circulatory system: Secondary | ICD-10-CM | POA: Diagnosis not present

## 2017-06-01 DIAGNOSIS — K219 Gastro-esophageal reflux disease without esophagitis: Secondary | ICD-10-CM | POA: Insufficient documentation

## 2017-06-01 DIAGNOSIS — E7800 Pure hypercholesterolemia, unspecified: Secondary | ICD-10-CM | POA: Insufficient documentation

## 2017-06-01 DIAGNOSIS — K8000 Calculus of gallbladder with acute cholecystitis without obstruction: Secondary | ICD-10-CM | POA: Diagnosis not present

## 2017-06-01 DIAGNOSIS — Z79899 Other long term (current) drug therapy: Secondary | ICD-10-CM | POA: Diagnosis not present

## 2017-06-01 DIAGNOSIS — K8012 Calculus of gallbladder with acute and chronic cholecystitis without obstruction: Secondary | ICD-10-CM | POA: Insufficient documentation

## 2017-06-01 DIAGNOSIS — F329 Major depressive disorder, single episode, unspecified: Secondary | ICD-10-CM | POA: Insufficient documentation

## 2017-06-01 DIAGNOSIS — K8066 Calculus of gallbladder and bile duct with acute and chronic cholecystitis without obstruction: Secondary | ICD-10-CM | POA: Diagnosis present

## 2017-06-01 HISTORY — PX: CHOLECYSTECTOMY: SHX55

## 2017-06-01 SURGERY — LAPAROSCOPIC CHOLECYSTECTOMY
Anesthesia: General

## 2017-06-01 MED ORDER — ONDANSETRON HCL 4 MG/2ML IJ SOLN
INTRAMUSCULAR | Status: AC
  Filled 2017-06-01: qty 2

## 2017-06-01 MED ORDER — HEMOSTATIC AGENTS (NO CHARGE) OPTIME
TOPICAL | Status: DC | PRN
  Administered 2017-06-01: 1 via TOPICAL

## 2017-06-01 MED ORDER — SUCCINYLCHOLINE CHLORIDE 20 MG/ML IJ SOLN
INTRAMUSCULAR | Status: DC | PRN
  Administered 2017-06-01: 60 mg via INTRAVENOUS
  Administered 2017-06-01: 140 mg via INTRAVENOUS

## 2017-06-01 MED ORDER — DEXAMETHASONE SODIUM PHOSPHATE 4 MG/ML IJ SOLN
4.0000 mg | Freq: Once | INTRAMUSCULAR | Status: AC
  Administered 2017-06-01: 4 mg via INTRAVENOUS

## 2017-06-01 MED ORDER — CHLORHEXIDINE GLUCONATE CLOTH 2 % EX PADS: 6.0000 | MEDICATED_PAD | Freq: Once | CUTANEOUS | Status: DC

## 2017-06-01 MED ORDER — SODIUM CHLORIDE 0.9 % IR SOLN
Status: DC | PRN
  Administered 2017-06-01: 1000 mL

## 2017-06-01 MED ORDER — SUGAMMADEX SODIUM 500 MG/5ML IV SOLN
INTRAVENOUS | Status: DC | PRN
Start: 2017-06-01 — End: 2017-06-01
  Administered 2017-06-01: 223.2 mg via INTRAVENOUS

## 2017-06-01 MED ORDER — SUGAMMADEX SODIUM 500 MG/5ML IV SOLN
INTRAVENOUS | Status: AC
  Filled 2017-06-01: qty 5

## 2017-06-01 MED ORDER — FENTANYL CITRATE (PF) 100 MCG/2ML IJ SOLN
25.0000 ug | INTRAMUSCULAR | Status: DC | PRN
  Administered 2017-06-01: 50 ug via INTRAVENOUS
  Filled 2017-06-01: qty 2

## 2017-06-01 MED ORDER — POVIDONE-IODINE 10 % EX OINT
TOPICAL_OINTMENT | CUTANEOUS | Status: AC
  Filled 2017-06-01: qty 1

## 2017-06-01 MED ORDER — CEFAZOLIN SODIUM-DEXTROSE 2-4 GM/100ML-% IV SOLN
2.0000 g | INTRAVENOUS | Status: AC
  Administered 2017-06-01: 2 g via INTRAVENOUS
  Filled 2017-06-01: qty 100

## 2017-06-01 MED ORDER — ROCURONIUM BROMIDE 100 MG/10ML IV SOLN
INTRAVENOUS | Status: DC | PRN
  Administered 2017-06-01: 10 mg via INTRAVENOUS
  Administered 2017-06-01: 20 mg via INTRAVENOUS

## 2017-06-01 MED ORDER — SUCCINYLCHOLINE CHLORIDE 20 MG/ML IJ SOLN
INTRAMUSCULAR | Status: AC
  Filled 2017-06-01: qty 1

## 2017-06-01 MED ORDER — LACTATED RINGERS IV SOLN
INTRAVENOUS | Status: DC
  Administered 2017-06-01 (×2): via INTRAVENOUS

## 2017-06-01 MED ORDER — KETOROLAC TROMETHAMINE 30 MG/ML IJ SOLN
30.0000 mg | Freq: Once | INTRAMUSCULAR | Status: AC
  Administered 2017-06-01: 30 mg via INTRAVENOUS

## 2017-06-01 MED ORDER — FENTANYL CITRATE (PF) 250 MCG/5ML IJ SOLN
INTRAMUSCULAR | Status: AC
  Filled 2017-06-01: qty 5

## 2017-06-01 MED ORDER — SUCCINYLCHOLINE CHLORIDE 20 MG/ML IJ SOLN
INTRAMUSCULAR | Status: AC
  Filled 2017-06-01: qty 2

## 2017-06-01 MED ORDER — GLYCOPYRROLATE 0.2 MG/ML IJ SOLN
0.2000 mg | Freq: Once | INTRAMUSCULAR | Status: AC
  Administered 2017-06-01: 0.2 mg via INTRAVENOUS

## 2017-06-01 MED ORDER — TRAMADOL HCL 50 MG PO TABS: 100.0000 mg | ORAL_TABLET | Freq: Four times a day (QID) | ORAL | 0 refills | Status: DC | PRN

## 2017-06-01 MED ORDER — KETOROLAC TROMETHAMINE 30 MG/ML IJ SOLN
INTRAMUSCULAR | Status: AC
  Filled 2017-06-01: qty 1

## 2017-06-01 MED ORDER — DEXAMETHASONE SODIUM PHOSPHATE 4 MG/ML IJ SOLN
INTRAMUSCULAR | Status: AC
  Filled 2017-06-01: qty 1

## 2017-06-01 MED ORDER — GLYCOPYRROLATE 0.2 MG/ML IJ SOLN
INTRAMUSCULAR | Status: AC
  Filled 2017-06-01: qty 1

## 2017-06-01 MED ORDER — SODIUM CHLORIDE 0.9 % IJ SOLN
INTRAMUSCULAR | Status: AC
  Filled 2017-06-01: qty 10

## 2017-06-01 MED ORDER — MIDAZOLAM HCL 2 MG/2ML IJ SOLN
INTRAMUSCULAR | Status: AC
  Filled 2017-06-01: qty 2

## 2017-06-01 MED ORDER — EPHEDRINE SULFATE 50 MG/ML IJ SOLN
INTRAMUSCULAR | Status: AC
  Filled 2017-06-01: qty 1

## 2017-06-01 MED ORDER — BUPIVACAINE HCL (PF) 0.5 % IJ SOLN
INTRAMUSCULAR | Status: DC | PRN
  Administered 2017-06-01: 10 mL

## 2017-06-01 MED ORDER — MIDAZOLAM HCL 2 MG/2ML IJ SOLN
1.0000 mg | INTRAMUSCULAR | Status: AC
  Administered 2017-06-01: 2 mg via INTRAVENOUS

## 2017-06-01 MED ORDER — POVIDONE-IODINE 10 % OINT PACKET
TOPICAL_OINTMENT | CUTANEOUS | Status: DC | PRN
  Administered 2017-06-01: 1 via TOPICAL

## 2017-06-01 MED ORDER — PROPOFOL 10 MG/ML IV BOLUS
INTRAVENOUS | Status: AC
  Filled 2017-06-01: qty 40

## 2017-06-01 MED ORDER — FENTANYL CITRATE (PF) 100 MCG/2ML IJ SOLN
INTRAMUSCULAR | Status: DC | PRN
  Administered 2017-06-01 (×4): 50 ug via INTRAVENOUS

## 2017-06-01 MED ORDER — LIDOCAINE HCL (PF) 1 % IJ SOLN
INTRAMUSCULAR | Status: AC
  Filled 2017-06-01: qty 5

## 2017-06-01 MED ORDER — ARTIFICIAL TEARS OPHTHALMIC OINT
TOPICAL_OINTMENT | OPHTHALMIC | Status: DC | PRN
  Administered 2017-06-01: 6 via OPHTHALMIC

## 2017-06-01 MED ORDER — ONDANSETRON HCL 4 MG/2ML IJ SOLN
4.0000 mg | Freq: Once | INTRAMUSCULAR | Status: AC
  Administered 2017-06-01: 4 mg via INTRAVENOUS

## 2017-06-01 MED ORDER — BUPIVACAINE HCL (PF) 0.5 % IJ SOLN
INTRAMUSCULAR | Status: AC
  Filled 2017-06-01: qty 30

## 2017-06-01 MED ORDER — LIDOCAINE HCL (CARDIAC) 20 MG/ML IV SOLN
INTRAVENOUS | Status: DC | PRN
  Administered 2017-06-01: 40 mg via INTRAVENOUS

## 2017-06-01 MED ORDER — ROCURONIUM BROMIDE 50 MG/5ML IV SOLN
INTRAVENOUS | Status: AC
  Filled 2017-06-01: qty 1

## 2017-06-01 MED ORDER — PROPOFOL 10 MG/ML IV BOLUS
INTRAVENOUS | Status: DC | PRN
  Administered 2017-06-01 (×2): 50 mg via INTRAVENOUS
  Administered 2017-06-01: 150 mg via INTRAVENOUS

## 2017-06-01 SURGICAL SUPPLY — 50 items
APPLIER CLIP ROT 10 11.4 M/L (STAPLE) ×6
BAG HAMPER (MISCELLANEOUS) ×3 IMPLANT
BAG RETRIEVAL 10 (BASKET) ×1
BAG RETRIEVAL 10MM (BASKET) ×1
CHLORAPREP W/TINT 26ML (MISCELLANEOUS) ×3 IMPLANT
CLIP APPLIE ROT 10 11.4 M/L (STAPLE) ×2 IMPLANT
CLOTH BEACON ORANGE TIMEOUT ST (SAFETY) ×3 IMPLANT
COVER LIGHT HANDLE STERIS (MISCELLANEOUS) ×6 IMPLANT
DECANTER SPIKE VIAL GLASS SM (MISCELLANEOUS) ×3 IMPLANT
ELECT REM PT RETURN 9FT ADLT (ELECTROSURGICAL) ×3
ELECTRODE REM PT RTRN 9FT ADLT (ELECTROSURGICAL) ×1 IMPLANT
FILTER SMOKE EVAC LAPAROSHD (FILTER) ×3 IMPLANT
FORMALIN 10 PREFIL 120ML (MISCELLANEOUS) ×3 IMPLANT
GLOVE BIOGEL PI IND STRL 6.5 (GLOVE) ×1 IMPLANT
GLOVE BIOGEL PI IND STRL 7.0 (GLOVE) ×1 IMPLANT
GLOVE BIOGEL PI IND STRL 7.5 (GLOVE) ×1 IMPLANT
GLOVE BIOGEL PI INDICATOR 6.5 (GLOVE) ×2
GLOVE BIOGEL PI INDICATOR 7.0 (GLOVE) ×2
GLOVE BIOGEL PI INDICATOR 7.5 (GLOVE) ×2
GLOVE ECLIPSE 6.5 STRL STRAW (GLOVE) ×6 IMPLANT
GLOVE SURG SS PI 7.5 STRL IVOR (GLOVE) ×3 IMPLANT
GOWN STRL REUS W/ TWL XL LVL3 (GOWN DISPOSABLE) ×1 IMPLANT
GOWN STRL REUS W/TWL LRG LVL3 (GOWN DISPOSABLE) ×6 IMPLANT
GOWN STRL REUS W/TWL XL LVL3 (GOWN DISPOSABLE) ×2
HEMOSTAT SNOW SURGICEL 2X4 (HEMOSTASIS) ×3 IMPLANT
INST SET LAPROSCOPIC AP (KITS) ×3 IMPLANT
IV NS IRRIG 3000ML ARTHROMATIC (IV SOLUTION) IMPLANT
KIT ROOM TURNOVER APOR (KITS) ×3 IMPLANT
MANIFOLD NEPTUNE II (INSTRUMENTS) ×3 IMPLANT
NEEDLE INSUFFLATION 14GA 120MM (NEEDLE) ×3 IMPLANT
NS IRRIG 1000ML POUR BTL (IV SOLUTION) ×3 IMPLANT
PACK LAP CHOLE LZT030E (CUSTOM PROCEDURE TRAY) ×3 IMPLANT
PAD ARMBOARD 7.5X6 YLW CONV (MISCELLANEOUS) ×3 IMPLANT
SET BASIN LINEN APH (SET/KITS/TRAYS/PACK) ×3 IMPLANT
SET TUBE IRRIG SUCTION NO TIP (IRRIGATION / IRRIGATOR) IMPLANT
SLEEVE ENDOPATH XCEL 5M (ENDOMECHANICALS) ×3 IMPLANT
SPONGE GAUZE 2X2 8PLY STER LF (GAUZE/BANDAGES/DRESSINGS) ×4
SPONGE GAUZE 2X2 8PLY STRL LF (GAUZE/BANDAGES/DRESSINGS) ×8 IMPLANT
STAPLER VISISTAT (STAPLE) ×3 IMPLANT
SUT VICRYL 0 UR6 27IN ABS (SUTURE) ×3 IMPLANT
SYS BAG RETRIEVAL 10MM (BASKET) ×1
SYSTEM BAG RETRIEVAL 10MM (BASKET) ×1 IMPLANT
TAPE CLOTH SURG 4X10 WHT LF (GAUZE/BANDAGES/DRESSINGS) ×3 IMPLANT
TROCAR ENDO BLADELESS 11MM (ENDOMECHANICALS) ×3 IMPLANT
TROCAR XCEL NON-BLD 5MMX100MML (ENDOMECHANICALS) ×3 IMPLANT
TROCAR XCEL UNIV SLVE 11M 100M (ENDOMECHANICALS) ×3 IMPLANT
TUBE CONNECTING 12'X1/4 (SUCTIONS) ×1
TUBE CONNECTING 12X1/4 (SUCTIONS) ×2 IMPLANT
TUBING INSUFFLATION (TUBING) ×3 IMPLANT
WARMER LAPAROSCOPE (MISCELLANEOUS) ×3 IMPLANT

## 2017-06-01 NOTE — Anesthesia Postprocedure Evaluation (Signed)
Anesthesia Post Note  Patient: Renee Klein  Procedure(s) Performed: Procedure(s) (LRB): LAPAROSCOPIC CHOLECYSTECTOMY (N/A)  Patient location during evaluation: PACU Anesthesia Type: General Level of consciousness: awake and alert, oriented and patient cooperative Pain management: pain level controlled Vital Signs Assessment: post-procedure vital signs reviewed and stable Respiratory status: spontaneous breathing and patient connected to nasal cannula oxygen Cardiovascular status: stable Postop Assessment: no apparent nausea or vomiting Anesthetic complications: no     Last Vitals:  Vitals:   06/01/17 0852 06/01/17 0900  BP: (!) 114/57 (!) 113/56  Pulse: (!) 110 (!) 109  Resp: (!) 28 16  Temp: 36.7 C   SpO2: 91% 100%    Last Pain:  Vitals:   06/01/17 0852  PainSc: 0-No pain                 Eaven Schwager J

## 2017-06-01 NOTE — Op Note (Signed)
Patient:  Renee Klein  DOB:  29-Nov-1955  MRN:  [id number]   Preop Diagnosis:  Acute cholecystitis, cholelithiasis  Postop Diagnosis:  Same  Procedure:  Laparoscopic cholecystectomy  Surgeon:  Franky Macho, M.D.  Asst.: Algis Greenhouse, M.D.  Anes:  Gen. endotracheal  Indications:  Patient is a 61 year old white female who presents with acute cholecystitis secondary to cholelithiasis. The risks and benefits of the procedure including bleeding, infection, hepatobiliary injury, and the possibility of an open procedure were fully explained to the patient, who gave informed consent.  Procedure note:  The patient was placed in the supine position. After induction of general endotracheal anesthesia, the abdomen was prepped and draped using the usual sterile technique with DuraPrep. Surgical site confirmation was performed.  A supraumbilical incision was made down to the fascia. A Veress needle was introduced into the abdominal cavity and confirmation of placement was done using the saline drop test. The abdomen was then insufflated to 16 mmHg pressure. An 11 mm trocar was introduced into the abdominal cavity under direct visualization without difficulty. The patient was placed in reverse Trendelenburg position and an additional 11 mm trocar was placed the epigastric region and 5 mm trochars were placed the right upper quadrant and right flank regions. Liver was inspected and noted to be within normal limits. The gallbladder was noted to be edematous with a thickened gallbladder wall. The gallbladder was retracted in a dynamic fashion in order to provide a critical view of the triangle of Calot. The cystic duct was first identified. Its juncture to the infundibulum was fully identified. Endoclips were placed proximally and distally on the cystic duct, and the cystic duct was divided. This was likewise done to the cystic artery. The gallbladder was freed away from the gallbladder fossa using Bovie  electrocautery. The gallbladder was limited to the epigastric trocar site using an Endo Catch bag. The gallbladder fossa was inspected and no abnormal bleeding or bile leakage was noted. Surgicel was placed the gallbladder fossa. All fluid and air were then evacuated from the abdominal cavity prior to the removal of the trochars.  All wounds were irrigated with normal saline. All wounds were injected with 0.5% Sensorcaine. The epigastric fascia was reapproximated using 0 Vicryl interrupted suture. All skin incisions were closed using staples. Betadine ointment and dry sterile dressings were applied.  All tape and needle counts were correct at the end of the procedure. Patient was extubated in the operating room and transferred to PACU in stable condition.  Complications:  None  EBL:  Minimal  Specimen:  Gallbladder

## 2017-06-01 NOTE — Anesthesia Procedure Notes (Signed)
Procedure Name: Intubation Date/Time: 06/01/2017 7:39 AM Performed by: Pernell Dupre, AMY A Pre-anesthesia Checklist: Timeout performed, Patient identified, Emergency Drugs available, Suction available and Patient being monitored Patient Re-evaluated:Patient Re-evaluated prior to induction Oxygen Delivery Method: Circle system utilized Preoxygenation: Pre-oxygenation with 100% oxygen Induction Type: IV induction, Rapid sequence and Cricoid Pressure applied Ventilation: Mask ventilation without difficulty Laryngoscope Size: Glidescope and 4 Grade View: Grade III Tube type: Oral Tube size: 7.0 mm Placement Confirmation: ETT inserted through vocal cords under direct vision,  breath sounds checked- equal and bilateral and positive ETCO2 Secured at: 22 cm Tube secured with: Tape Dental Injury: Teeth and Oropharynx as per pre-operative assessment  Difficulty Due To: Difficulty was unanticipated Comments: DL x1 Miller 3 grade 3 view by CRNA; head repositioned,DL again by CRNA with Hyacinth Meeker 3 grade 2 view but unable to pass ETT due to very snug fit and immobility;Glidescope 4 used by Dr. Jayme Cloud for 7.0 ETT; VSS throughout; easy mask; no trauma noted from intubation attempts

## 2017-06-01 NOTE — Discharge Instructions (Signed)
Laparoscopic Cholecystectomy, Care After °This sheet gives you information about how to care for yourself after your procedure. Your health care provider may also give you more specific instructions. If you have problems or questions, contact your health care provider. °What can I expect after the procedure? °After the procedure, it is common to have: °· Pain at your incision sites. You will be given medicines to control this pain. °· Mild nausea or vomiting. °· Bloating and possible shoulder pain from the air-like gas that was used during the procedure. °Follow these instructions at home: °Incision care  ° °· Follow instructions from your health care provider about how to take care of your incisions. Make sure you: °¨ Wash your hands with soap and water before you change your bandage (dressing). If soap and water are not available, use hand sanitizer. °¨ Change your dressing as told by your health care provider. °¨ Leave stitches (sutures), skin glue, or adhesive strips in place. These skin closures may need to be in place for 2 weeks or longer. If adhesive strip edges start to loosen and curl up, you may trim the loose edges. Do not remove adhesive strips completely unless your health care provider tells you to do that. °· Do not take baths, swim, or use a hot tub until your health care provider approves. Ask your health care provider if you can take showers. You may only be allowed to take sponge baths for bathing. °· Check your incision area every day for signs of infection. Check for: °¨ More redness, swelling, or pain. °¨ More fluid or blood. °¨ Warmth. °¨ Pus or a bad smell. °Activity  °· Do not drive or use heavy machinery while taking prescription pain medicine. °· Do not lift anything that is heavier than 10 lb (4.5 kg) until your health care provider approves. °· Do not play contact sports until your health care provider approves. °· Do not drive for 24 hours if you were given a medicine to help you relax  (sedative). °· Rest as needed. Do not return to work or school until your health care provider approves. °General instructions  °· Take over-the-counter and prescription medicines only as told by your health care provider. °· To prevent or treat constipation while you are taking prescription pain medicine, your health care provider may recommend that you: °¨ Drink enough fluid to keep your urine clear or pale yellow. °¨ Take over-the-counter or prescription medicines. °¨ Eat foods that are high in fiber, such as fresh fruits and vegetables, whole grains, and beans. °¨ Limit foods that are high in fat and processed sugars, such as fried and sweet foods. °Contact a health care provider if: °· You develop a rash. °· You have more redness, swelling, or pain around your incisions. °· You have more fluid or blood coming from your incisions. °· Your incisions feel warm to the touch. °· You have pus or a bad smell coming from your incisions. °· You have a fever. °· One or more of your incisions breaks open. °Get help right away if: °· You have trouble breathing. °· You have chest pain. °· You have increasing pain in your shoulders. °· You faint or feel dizzy when you stand. °· You have severe pain in your abdomen. °· You have nausea or vomiting that lasts for more than one day. °· You have leg pain. °This information is not intended to replace advice given to you by your health care provider. Make sure you discuss any   questions you have with your health care provider. °Document Released: 09/01/2005 Document Revised: 03/22/2016 Document Reviewed: 02/18/2016 °Elsevier Interactive Patient Education © 2017 [employer] Inc. ° °

## 2017-06-01 NOTE — Transfer of Care (Signed)
Immediate Anesthesia Transfer of Care Note  Patient: Renee Klein  Procedure(s) Performed: Procedure(s): LAPAROSCOPIC CHOLECYSTECTOMY (N/A)  Patient Location: PACU  Anesthesia Type:General  Level of Consciousness: awake, oriented and patient cooperative  Airway & Oxygen Therapy: Patient Spontanous Breathing and Patient connected to nasal cannula oxygen  Post-op Assessment: Report given to RN and Post -op Vital signs reviewed and stable  Post vital signs: Reviewed and stable  Last Vitals:  Vitals:   06/01/17 0700 06/01/17 0715  BP: 139/68 (!) 122/58  Resp: 12 10  Temp:    SpO2: 95% 91%    Last Pain:  Vitals:   06/01/17 0627  PainSc: 0-No pain      Patients Stated Pain Goal: 10 (06/01/17 8295)  Complications: No apparent anesthesia complications

## 2017-06-01 NOTE — Interval H&P Note (Signed)
History and Physical Interval Note:  06/01/2017 7:15 AM  Renee Klein  has presented today for surgery, with the diagnosis of biliary colic, cholelithiasis  The various methods of treatment have been discussed with the patient and family. After consideration of risks, benefits and other options for treatment, the patient has consented to  Procedure(s): LAPAROSCOPIC CHOLECYSTECTOMY (N/A) as a surgical intervention .  The patient's history has been reviewed, patient examined, no change in status, stable for surgery.  I have reviewed the patient's chart and labs.  Questions were answered to the patient's satisfaction.     Franky Macho

## 2017-06-01 NOTE — Progress Notes (Signed)
Please excuse Renee Klein from work until 06/09/2017.  She had a procedure at [facility] that will require her to be out of work until then.

## 2017-06-01 NOTE — Anesthesia Preprocedure Evaluation (Signed)
Anesthesia Evaluation  Patient identified by MRN, date of birth, ID band Patient awake    Reviewed: Allergy & Precautions, NPO status , Patient's Chart, lab work & pertinent test results  Airway Mallampati: II  TM Distance: >3 FB Neck ROM: Full    Dental  (+) Teeth Intact   Pulmonary asthma ,    breath sounds clear to auscultation       Cardiovascular negative cardio ROS   Rhythm:Regular Rate:Normal     Neuro/Psych PSYCHIATRIC DISORDERS Depression    GI/Hepatic GERD  Controlled and Medicated,  Endo/Other  Morbid obesity  Renal/GU      Musculoskeletal   Abdominal   Peds  Hematology   Anesthesia Other Findings   Reproductive/Obstetrics                             Anesthesia Physical Anesthesia Plan  ASA: II  Anesthesia Plan: General   Post-op Pain Management:    Induction: Rapid sequence and Cricoid pressure planned  PONV Risk Score and Plan:   Airway Management Planned: Oral ETT  Additional Equipment:   Intra-op Plan:   Post-operative Plan: Extubation in OR  Informed Consent: I have reviewed the patients History and Physical, chart, labs and discussed the procedure including the risks, benefits and alternatives for the proposed anesthesia with the patient or authorized representative who has indicated his/her understanding and acceptance.     Plan Discussed with:   Anesthesia Plan Comments:         Anesthesia Quick Evaluation

## 2017-06-01 NOTE — Anesthesia Postprocedure Evaluation (Signed)
Anesthesia Post Note  Patient: Genice Kimberlin  Procedure(s) Performed: Procedure(s) (LRB): LAPAROSCOPIC CHOLECYSTECTOMY (N/A)  Patient location during evaluation: PACU Anesthesia Type: General Level of consciousness: awake, oriented and patient cooperative Pain management: pain level controlled Vital Signs Assessment: post-procedure vital signs reviewed and stable Respiratory status: spontaneous breathing, respiratory function stable and patient connected to nasal cannula oxygen Cardiovascular status: stable Postop Assessment: no apparent nausea or vomiting Anesthetic complications: no     Last Vitals:  Vitals:   06/01/17 0700 06/01/17 0715  BP: 139/68 (!) 122/58  Resp: 12 10  Temp:    SpO2: 95% 91%    Last Pain:  Vitals:   06/01/17 0627  PainSc: 0-No pain                 Refugio Vandevoorde A

## 2017-06-02 ENCOUNTER — Encounter (HOSPITAL_COMMUNITY): Payer: Self-pay | Admitting: General Surgery

## 2017-06-09 ENCOUNTER — Ambulatory Visit (INDEPENDENT_AMBULATORY_CARE_PROVIDER_SITE_OTHER): Payer: Self-pay | Admitting: General Surgery

## 2017-06-09 ENCOUNTER — Encounter: Payer: Self-pay | Admitting: General Surgery

## 2017-06-09 VITALS — BP 149/85 | HR 84 | Temp 97.8°F | Resp 20 | Ht 65.0 in | Wt 241.0 lb

## 2017-06-09 DIAGNOSIS — Z09 Encounter for follow-up examination after completed treatment for conditions other than malignant neoplasm: Secondary | ICD-10-CM

## 2017-06-09 NOTE — Progress Notes (Signed)
Subjective:     Renee Klein  Status post laparoscopic cholecystectomy. Doing well. No complaints. Objective:    BP (!) 149/85   Pulse 84   Temp 97.8 F (36.6 C)   Resp 20   Ht  (1.651 m)   Wt 241 lb (109.3 kg)   BMI 40.10 kg/m   General:  alert, cooperative and no distress  Abdomen soft, incisions healing well. Staples removed, Steri-Strips applied. Final pathology consistent with diagnosis.     Assessment:    Doing well postoperatively.    Plan:   Gradually resume normal activity. Follow-up here as needed.

## 2018-02-18 ENCOUNTER — Telehealth (INDEPENDENT_AMBULATORY_CARE_PROVIDER_SITE_OTHER): Payer: Self-pay | Admitting: Orthopaedic Surgery

## 2018-02-18 NOTE — Telephone Encounter (Signed)
Noted  

## 2018-02-18 NOTE — Telephone Encounter (Signed)
Patient called needing an injection in her left knee. Patient said the injection would need approval from the insurance company. The number to contact patient is [phone]

## 2018-02-18 NOTE — Telephone Encounter (Signed)
Can we order on her left knee please

## 2018-02-19 ENCOUNTER — Telehealth (INDEPENDENT_AMBULATORY_CARE_PROVIDER_SITE_OTHER): Payer: Self-pay

## 2018-02-19 NOTE — Telephone Encounter (Signed)
Submitted application online for Monovisc injection, left knee. 

## 2018-03-04 ENCOUNTER — Telehealth (INDEPENDENT_AMBULATORY_CARE_PROVIDER_SITE_OTHER): Payer: Self-pay

## 2018-03-04 NOTE — Telephone Encounter (Signed)
Called to initiate PA for Monovisc, left knee.  PA form will be faxed per Dola ArgyleJames R. With [employer]

## 2018-03-11 NOTE — Telephone Encounter (Signed)
Patient called this morning wanting to check the status of her Monovisc injection.  CB#[phone].  Thank you.

## 2018-03-11 NOTE — Telephone Encounter (Signed)
Can you please call her back and tell her that this needs prior authorization, and that we will submit it Friday?  Thanks.

## 2018-03-12 NOTE — Telephone Encounter (Signed)
Talked with patient and advised her of message per Reginia NaasWendy M.

## 2018-03-15 ENCOUNTER — Telehealth (INDEPENDENT_AMBULATORY_CARE_PROVIDER_SITE_OTHER): Payer: Self-pay

## 2018-03-15 NOTE — Telephone Encounter (Signed)
PA form faxed for Monovisc, left knee to [employer] at [phone] or [phone] per Toniann FailWendy M.on 03/12/18

## 2018-03-15 NOTE — Telephone Encounter (Signed)
Talked with patient concerning gel injection.  Advised patient that PA form was faxed on Friday, 03/12/18 and we are currently waiting on a response from the insurance to see if PA is approved.  Once approved patient will receive a call back to schedule.

## 2018-03-19 ENCOUNTER — Telehealth (INDEPENDENT_AMBULATORY_CARE_PROVIDER_SITE_OTHER): Payer: Self-pay | Admitting: Physician Assistant

## 2018-03-19 NOTE — Telephone Encounter (Signed)
Patient called and said you were going to check and get an update on Wednesday about authorization, patient is upset since she has been waiting a month to get. Patients # [phone]

## 2018-03-19 NOTE — Telephone Encounter (Signed)
Please see below. You have been working on approval for [employer] injection.

## 2018-03-19 NOTE — Telephone Encounter (Signed)
Mel, from [employer] left a message stating that they need more clinical information so that they can proceed with the prior authorization review.  CB#[phone].  Thank you.

## 2018-03-22 ENCOUNTER — Telehealth (INDEPENDENT_AMBULATORY_CARE_PROVIDER_SITE_OTHER): Payer: Self-pay

## 2018-03-22 NOTE — Telephone Encounter (Signed)
[facility] with [facility] [employer] concerning patient's Monovisc injection.

## 2018-03-22 NOTE — Telephone Encounter (Signed)
[facility] Klein[facility] [employer] and was advised that additional information was needed to continue with authorization for Monovisc gel injection.  Provided Renee SettlerKristine S. with the information and was given a pending reference# for the authorization. Authorization is still pending.  Reference# is [id number]

## 2018-03-22 NOTE — Telephone Encounter (Signed)
Patient approved to have Monovisc injection, left knee. Covered at 100%  [employer]-pay $40.00? PA Approved-reference# [id number] Valid 03/12/2018- 06/10/2018  Appt.scheduled 03/24/2018

## 2018-03-22 NOTE — Telephone Encounter (Signed)
Talked with patient and advised her that I [facility]with [employer] and she stated that authorization is still pending and once a decision is made, then our office will receive a fax.

## 2018-03-24 ENCOUNTER — Encounter (INDEPENDENT_AMBULATORY_CARE_PROVIDER_SITE_OTHER): Payer: Self-pay | Admitting: Physician Assistant

## 2018-03-24 ENCOUNTER — Ambulatory Visit (INDEPENDENT_AMBULATORY_CARE_PROVIDER_SITE_OTHER): Payer: 59 | Admitting: Physician Assistant

## 2018-03-24 DIAGNOSIS — M1712 Unilateral primary osteoarthritis, left knee: Secondary | ICD-10-CM | POA: Diagnosis not present

## 2018-03-24 DIAGNOSIS — M1711 Unilateral primary osteoarthritis, right knee: Secondary | ICD-10-CM

## 2018-03-24 MED ORDER — LIDOCAINE HCL 1 % IJ SOLN
0.5000 mL | INTRAMUSCULAR | Status: AC | PRN
  Administered 2018-03-24: .5 mL

## 2018-03-24 MED ORDER — HYALURONAN 88 MG/4ML IX SOSY
88.0000 mg | PREFILLED_SYRINGE | INTRA_ARTICULAR | Status: AC | PRN
  Administered 2018-03-24: 88 mg via INTRA_ARTICULAR

## 2018-03-24 MED ORDER — METHYLPREDNISOLONE ACETATE 40 MG/ML IJ SUSP
40.0000 mg | INTRAMUSCULAR | Status: AC | PRN
  Administered 2018-03-24: 40 mg via INTRA_ARTICULAR

## 2018-03-24 NOTE — Progress Notes (Signed)
   Procedure Note  Patient: Renee Klein             Date of Birth: 12/27/1955           MRN: [id number]             Visit Date: 03/24/2018 HPI: Renee Klein returns today for Monovisc injection of the left knee.  She is also requesting injection in the right knee.  She has had no new injury to either knee.  She last had a Synvisc injection in the right knee 10/08/2015.  She feels most of her right knee pain is due to compensation.  She denies any fevers chills shortness of breath chest pain.  She has known osteoarthritis of both knees.    Physical exam: Bilateral knees no effusion abnormal warmth erythema.  Right knee nontender throughout left knee tender along medial lateral joint line.  Good range of motion of both knees with left knee patellofemoral crepitus.   Procedures: Visit Diagnoses: Primary osteoarthritis of left knee  Large Joint Inj: bilateral knee on 03/24/2018 4:48 PM Indications: pain Details: 22 G 1.5 in needle, anterolateral approach  Arthrogram: No  Medications (Right): 0.5 mL lidocaine 1 %; 88 mg Hyaluronan 88 MG/4ML Medications (Left): 0.5 mL lidocaine 1 %; 40 mg methylPREDNISolone acetate 40 MG/ML Outcome: tolerated well, no immediate complications Procedure, treatment alternatives, risks and benefits explained, specific risks discussed. Consent was given by the patient. Immediately prior to procedure a time out was called to verify the correct patient, procedure, equipment, support staff and site/side marked as required. Patient was prepped and draped in the usual sterile fashion.     Plan: She will follow-up with us in as-needed basis.  She has she needs to wait 3 months between steroid injections in 6 months between supplemental injections.

## 2018-08-18 ENCOUNTER — Ambulatory Visit (INDEPENDENT_AMBULATORY_CARE_PROVIDER_SITE_OTHER): Payer: 59

## 2018-08-18 ENCOUNTER — Ambulatory Visit (INDEPENDENT_AMBULATORY_CARE_PROVIDER_SITE_OTHER): Payer: 59 | Admitting: Orthopaedic Surgery

## 2018-08-18 ENCOUNTER — Encounter (INDEPENDENT_AMBULATORY_CARE_PROVIDER_SITE_OTHER): Payer: Self-pay | Admitting: Orthopaedic Surgery

## 2018-08-18 DIAGNOSIS — G8929 Other chronic pain: Secondary | ICD-10-CM

## 2018-08-18 DIAGNOSIS — M25562 Pain in left knee: Secondary | ICD-10-CM

## 2018-08-18 DIAGNOSIS — M25561 Pain in right knee: Secondary | ICD-10-CM

## 2018-08-18 DIAGNOSIS — M1712 Unilateral primary osteoarthritis, left knee: Secondary | ICD-10-CM | POA: Diagnosis not present

## 2018-08-18 DIAGNOSIS — M1611 Unilateral primary osteoarthritis, right hip: Secondary | ICD-10-CM

## 2018-08-18 NOTE — Progress Notes (Signed)
Office Visit Note   Patient: Renee Klein           Date of Birth: November 24, 1955           MRN: [id number] Visit Date: 08/18/2018              Requested by: Barbette Hair, MD [address] [place] [address] PCP: Barbette Hair, MD   Assessment & Plan: Visit Diagnoses:  1. Chronic pain of left knee   2. Chronic pain of right knee   3. Unilateral primary osteoarthritis, left knee   4. Unilateral primary osteoarthritis, right hip     Plan:  We will order Synvisc injections for both knees to be performed in early January.  She like to continue with conservative measures as long as they do give her good relief of her knee pain and allow her to remain active.  Follow-Up Instructions: Return for Supplemental injection.   Orders:  Orders Placed This Encounter  Procedures  . XR Knee 1-2 Views Left  . XR Knee 1-2 Views Right   No orders of the defined types were placed in this encounter.     Procedures: No procedures performed   Clinical Data: No additional findings.   Subjective: Chief Complaint  Patient presents with  . Left Knee - Pain    HPI Renee Klein  comes in today mostly due to left knee pain. But having some right knee pain.  She was last seen in July 2019 and was given Monovisc injection both knees.  She states that the Monovisc injection did help some.  She has had an increase in pain in the left knee.  Review of Systems No fevers or chills.   Objective: Vital Signs: There were no vitals taken for this visit.  Physical Exam  Constitutional: She is oriented to person, place, and time. She appears well-developed and well-nourished. No distress.  Pulmonary/Chest: Effort normal.  Neurological: She is alert and oriented to person, place, and time.  Skin: She is not diaphoretic.  Psychiatric: She has a normal mood and affect.    Ortho Exam Bilateral knees patellofemoral crepitus.  Tenderness medial joint line bilaterally.  No  instability.  No abnormal warmth erythema or effusion. Specialty Comments:  No specialty comments available.  Imaging: Xr Knee 1-2 Views Left  Result Date: 08/18/2018  Left knee AP and lateral views: No acute fractures.  Moderate severe medial compartmental narrowing. Osteophytes of the lateral compartment margins.   Moderate patellofemoral changes.  No bony abnormalities otherwise.   Xr Knee 1-2 Views Right  Result Date: 08/18/2018 Right knee AP and lateral views shows tricompartmental arthritis with advanced patellofemoral arthritis and moderate medial compartment arthritis minimal osteophytes off the lateral compartment margins.    PMFS History: Patient Active Problem List   Diagnosis Date Noted  . Calculus of gallbladder with acute cholecystitis without obstruction    Past Medical History:  Diagnosis Date  . Arthritis   . Asthma   . Concussion   . Depression   . Endometriosis   . GERD (gastroesophageal reflux disease)   . Heart murmur   . Hypercholesterolemia     Family History  Problem Relation Age of Onset  . Heart attack Father   . Other Father        heart valve surgery  . Heart attack Mother   . Seizures Sister   . Breast cancer Sister   . Hypertension Sister     Past  Surgical History:  Procedure Laterality Date  . ABDOMINAL HYSTERECTOMY    . bone scraping left thumb    . BUNIONECTOMY Bilateral   . CHOLECYSTECTOMY N/A 06/01/2017   Procedure: LAPAROSCOPIC CHOLECYSTECTOMY;  Surgeon: Franky MachoJenkins, Mark, MD;  Location: [facility];  Service: General;  Laterality: N/A;  . DIAGNOSTIC LAPAROSCOPY     endometriosis  . KNEE ARTHROPLASTY Left   . THUMB AMPUTATION Left    Scrape bone secondary to infection  . TONSILLECTOMY     Social History   Occupational History  . Occupation: Education officer, environmentalclinical researcher  Tobacco Use  . Smoking status: Never Smoker  . Smokeless tobacco: Never Used  Substance and Sexual Activity  . Alcohol use: Yes    Alcohol/week: 0.0 - 1.0 standard  drinks    Comment: rarely drinks alcohol  . Drug use: No  . Sexual activity: Not on file

## 2018-08-19 ENCOUNTER — Telehealth (INDEPENDENT_AMBULATORY_CARE_PROVIDER_SITE_OTHER): Payer: Self-pay

## 2018-08-19 NOTE — Telephone Encounter (Signed)
Bilateral Synvisc injections

## 2018-08-23 NOTE — Telephone Encounter (Signed)
Noted  

## 2018-09-21 ENCOUNTER — Telehealth (INDEPENDENT_AMBULATORY_CARE_PROVIDER_SITE_OTHER): Payer: Self-pay

## 2018-09-21 NOTE — Telephone Encounter (Signed)
Submitted VOB for Monovisc, bilateral knee. Appt. After 09/24/2018

## 2018-09-23 ENCOUNTER — Telehealth (INDEPENDENT_AMBULATORY_CARE_PROVIDER_SITE_OTHER): Payer: Self-pay

## 2018-09-23 NOTE — Telephone Encounter (Addendum)
Called and left patient a VM advising her that she is approved for gel injection and to CB and schedule an appointment.  Approved for Monovisc, bilateral knee. Buy & Bill Covered at 100% through her insurance. $40.00 Co-pay Per [employer] with [employer], no PA is required Reference# [id number]  Appointments needs to be after 09/24/2018 with Dr. Magnus Ivan

## 2018-10-06 ENCOUNTER — Ambulatory Visit (INDEPENDENT_AMBULATORY_CARE_PROVIDER_SITE_OTHER): Payer: 59 | Admitting: Physician Assistant

## 2018-10-06 ENCOUNTER — Encounter (INDEPENDENT_AMBULATORY_CARE_PROVIDER_SITE_OTHER): Payer: Self-pay | Admitting: Physician Assistant

## 2018-10-06 DIAGNOSIS — M1712 Unilateral primary osteoarthritis, left knee: Secondary | ICD-10-CM

## 2018-10-06 DIAGNOSIS — M170 Bilateral primary osteoarthritis of knee: Secondary | ICD-10-CM

## 2018-10-06 DIAGNOSIS — M1711 Unilateral primary osteoarthritis, right knee: Secondary | ICD-10-CM

## 2018-10-06 MED ORDER — HYALURONAN 88 MG/4ML IX SOSY
88.0000 mg | PREFILLED_SYRINGE | INTRA_ARTICULAR | Status: AC | PRN
  Administered 2018-10-06: 88 mg via INTRA_ARTICULAR

## 2018-10-06 NOTE — Progress Notes (Signed)
   Procedure Note  Patient: Branesha Mcintyre             Date of Birth: 07/17/1956           MRN: [id number]             Visit Date: 10/06/2018  HPI:  Mrs. Digiulio returns today for Monovisc injections in both knees.  She states that the last Monovisc injections worked well.  She is having some achy pain in both knees at this point.  She is had no known injury to either knee.  Physical exam: Bilateral knees good range of motion without pain.  No effusion abnormal warmth erythema of either knee.  Procedures: Visit Diagnoses: Primary osteoarthritis of left knee  Primary osteoarthritis of right knee  Large Joint Inj: bilateral knee on 10/06/2018 4:19 PM Indications: pain Details: 22 G 1.5 in needle, anterolateral approach  Arthrogram: No  Medications (Right): 88 mg Hyaluronan 88 MG/4ML Medications (Left): 88 mg Hyaluronan 88 MG/4ML Outcome: tolerated well, no immediate complications Procedure, treatment alternatives, risks and benefits explained, specific risks discussed. Consent was given by the patient. Immediately prior to procedure a time out was called to verify the correct patient, procedure, equipment, support staff and site/side marked as required. Patient was prepped and draped in the usual sterile fashion.     Plan: She understands that she needs to wait at least 6 months between supplemental injections.  She could have periodic steroid injections if in the if need be.  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  Follow-up on an as-needed basis.

## 2019-03-07 ENCOUNTER — Telehealth: Payer: Self-pay | Admitting: Physician Assistant

## 2019-03-07 NOTE — Telephone Encounter (Signed)
Patient called asked if she can get the gel injection? The number to contact patient is [phone]

## 2019-03-08 NOTE — Telephone Encounter (Signed)
She can't have injection until 04/06/19, but can we go ahead and precert?

## 2019-03-11 NOTE — Telephone Encounter (Signed)
Printed demographics for submission.  

## 2019-03-17 ENCOUNTER — Telehealth: Payer: Self-pay

## 2019-03-17 NOTE — Telephone Encounter (Signed)
PA required for Monovisc, bilateral knee. Faxed completed PA form to [employer] at [phone]. 

## 2019-03-21 ENCOUNTER — Telehealth: Payer: Self-pay

## 2019-03-21 NOTE — Telephone Encounter (Signed)
Received a call from [facility] with [employer] Certification and was advised that Monovisc is not a preferred product and the preferred products are Orthovisc, Synvisc, and SynviscOne.   Changed medication to SynviscOne.   Per Lyn with [employer], PA is Approved for SynviscOne, bilateral knee. PA Approval# [id number] Valid 03/21/2019- 03/19/2020  Submitted VOB for SynviscOne, bilateral knee.

## 2019-04-04 ENCOUNTER — Telehealth: Payer: Self-pay

## 2019-04-04 NOTE — Telephone Encounter (Signed)
Approved for SynviscOne, bilateral knee. [employer] Covered at 100% after Co-pay Co-pay of $80.00 required PA required PA Approval# [id number] Valid 03/21/2019- 03/19/2020  Appt. 04/07/2019 with Dr. Ninfa Linden

## 2019-04-07 ENCOUNTER — Encounter: Payer: Self-pay | Admitting: Physician Assistant

## 2019-04-07 ENCOUNTER — Ambulatory Visit (INDEPENDENT_AMBULATORY_CARE_PROVIDER_SITE_OTHER): Payer: 59 | Admitting: Physician Assistant

## 2019-04-07 ENCOUNTER — Other Ambulatory Visit: Payer: Self-pay

## 2019-04-07 DIAGNOSIS — M1711 Unilateral primary osteoarthritis, right knee: Secondary | ICD-10-CM

## 2019-04-07 DIAGNOSIS — M1712 Unilateral primary osteoarthritis, left knee: Secondary | ICD-10-CM

## 2019-04-07 MED ORDER — LIDOCAINE HCL 1 % IJ SOLN
0.5000 mL | INTRAMUSCULAR | Status: AC | PRN
  Administered 2019-04-07: .5 mL

## 2019-04-07 MED ORDER — HYLAN G-F 20 48 MG/6ML IX SOSY
48.0000 mg | PREFILLED_SYRINGE | INTRA_ARTICULAR | Status: AC | PRN
  Administered 2019-04-07: 48 mg via INTRA_ARTICULAR

## 2019-04-07 NOTE — Progress Notes (Signed)
   Procedure Note  Patient: Renee Klein             Date of Birth: 1956-04-01           MRN: [id number]             Visit Date: 04/07/2019 HPI: Renee Klein is well-known to our department service has known osteoarthritis of both knees.  She is had no new injury.  She comes in today for Synvisc 1 injections both knees.  Physical exam: Bilateral knees no abnormal warmth erythema or effusion overall good range of motion both knees. Procedures: Visit Diagnoses:  1. Primary osteoarthritis of left knee   2. Primary osteoarthritis of right knee     Large Joint Inj: bilateral knee on 04/07/2019 3:28 PM Indications: pain Details: 22 G 1.5 in needle, anterolateral approach  Arthrogram: No  Medications (Right): 0.5 mL lidocaine 1 %; 48 mg Hylan 48 MG/6ML Medications (Left): 0.5 mL lidocaine 1 %; 48 mg Hylan 48 MG/6ML Outcome: tolerated well, no immediate complications Procedure, treatment alternatives, risks and benefits explained, specific risks discussed. Consent was given by the patient. Immediately prior to procedure a time out was called to verify the correct patient, procedure, equipment, support staff and site/side marked as required. Patient was prepped and draped in the usual sterile fashion.     Plan: She will follow-up with [place] on an as-needed basis she understands she needs to wait the 6 months between symptoms injections and 3 months between cortisone injections.  Questions encouraged and answered at length

## 2019-05-06 ENCOUNTER — Other Ambulatory Visit: Payer: Self-pay

## 2019-05-06 ENCOUNTER — Emergency Department (HOSPITAL_COMMUNITY): Payer: 59

## 2019-05-06 ENCOUNTER — Encounter (HOSPITAL_COMMUNITY): Payer: Self-pay | Admitting: Emergency Medicine

## 2019-05-06 DIAGNOSIS — Z79899 Other long term (current) drug therapy: Secondary | ICD-10-CM | POA: Insufficient documentation

## 2019-05-06 DIAGNOSIS — Y9389 Activity, other specified: Secondary | ICD-10-CM | POA: Insufficient documentation

## 2019-05-06 DIAGNOSIS — W010XXA Fall on same level from slipping, tripping and stumbling without subsequent striking against object, initial encounter: Secondary | ICD-10-CM | POA: Insufficient documentation

## 2019-05-06 DIAGNOSIS — Y929 Unspecified place or not applicable: Secondary | ICD-10-CM | POA: Insufficient documentation

## 2019-05-06 DIAGNOSIS — Y999 Unspecified external cause status: Secondary | ICD-10-CM | POA: Insufficient documentation

## 2019-05-06 DIAGNOSIS — Z96652 Presence of left artificial knee joint: Secondary | ICD-10-CM | POA: Insufficient documentation

## 2019-05-06 DIAGNOSIS — R030 Elevated blood-pressure reading, without diagnosis of hypertension: Secondary | ICD-10-CM | POA: Insufficient documentation

## 2019-05-06 DIAGNOSIS — S5012XA Contusion of left forearm, initial encounter: Secondary | ICD-10-CM | POA: Diagnosis not present

## 2019-05-06 DIAGNOSIS — J45909 Unspecified asthma, uncomplicated: Secondary | ICD-10-CM | POA: Insufficient documentation

## 2019-05-06 DIAGNOSIS — S59912A Unspecified injury of left forearm, initial encounter: Secondary | ICD-10-CM | POA: Diagnosis present

## 2019-05-06 NOTE — ED Triage Notes (Signed)
Pt fell at home and landed on L arm. Bruising noted at wrist. Pt c/o pain from L wrist all the way up "into her shoulder".

## 2019-05-07 ENCOUNTER — Emergency Department (HOSPITAL_COMMUNITY)
Admission: EM | Admit: 2019-05-07 | Discharge: 2019-05-07 | Disposition: A | Discharge status: Home / Self Care | Payer: 59 | Attending: Emergency Medicine | Admitting: Emergency Medicine

## 2019-05-07 DIAGNOSIS — S5012XA Contusion of left forearm, initial encounter: Secondary | ICD-10-CM

## 2019-05-07 DIAGNOSIS — W010XXA Fall on same level from slipping, tripping and stumbling without subsequent striking against object, initial encounter: Secondary | ICD-10-CM

## 2019-05-07 DIAGNOSIS — R030 Elevated blood-pressure reading, without diagnosis of hypertension: Secondary | ICD-10-CM

## 2019-05-07 NOTE — Discharge Instructions (Addendum)
Apply ice for 30 minutes at a time, 3-4 times a day.  Take acetaminophen and/or ibuprofen as needed for pain.  Wear the wrist brace as needed.  Your blood pressure was elevated today.  Please have it checked several times in the next 1-2 weeks.  If it stays persistently elevated, you may need to be on medication to treat it.

## 2019-05-07 NOTE — ED Provider Notes (Signed)
[facility] EMERGENCY DEPARTMENT Provider Note   CSN: [id number] Arrival date & time: 05/06/19  2141    History   Chief Complaint Chief Complaint  Patient presents with  . Fall    HPI Renee RanDebra Klein is a 63 y.o. female.   The history is provided by the patient.  She has history of hyperlipidemia, asthma, endometriosis, GERD and comes in following a slip and fall.  She slipped on wet ground and landed on her left side injuring her left forearm.  She is also complaining of pain all the way up to her shoulder.  Pain is rated at 6/10.  She denies other injury.  Past Medical History:  Diagnosis Date  . Arthritis   . Asthma   . Concussion   . Depression   . Endometriosis   . GERD (gastroesophageal reflux disease)   . Heart murmur   . Hypercholesterolemia     Patient Active Problem List   Diagnosis Date Noted  . Primary osteoarthritis of right knee 04/07/2019  . Primary osteoarthritis of left knee 04/07/2019  . Calculus of gallbladder with acute cholecystitis without obstruction     Past Surgical History:  Procedure Laterality Date  . ABDOMINAL HYSTERECTOMY    . bone scraping left thumb    . BUNIONECTOMY Bilateral   . CHOLECYSTECTOMY N/A 06/01/2017   Procedure: LAPAROSCOPIC CHOLECYSTECTOMY;  Surgeon: Franky MachoJenkins, Mark, MD;  Location: [facility];  Service: General;  Laterality: N/A;  . DIAGNOSTIC LAPAROSCOPY     endometriosis  . KNEE ARTHROPLASTY Left   . THUMB AMPUTATION Left    Scrape bone secondary to infection  . TONSILLECTOMY       OB History   No obstetric history on file.      Home Medications    Prior to Admission medications   Medication Sig Start Date End Date Taking? Authorizing Provider  albuterol (PROVENTIL HFA;VENTOLIN HFA) 108 (90 BASE) MCG/ACT inhaler Inhale 2 puffs into the lungs every 6 (six) hours as needed. For shortness of breath    [provider]  ARNUITY ELLIPTA 200 MCG/ACT AEPB Inhale 2 puffs into the lungs daily.  11/20/16   [provider]  fluticasone (FLONASE) 50 MCG/ACT nasal spray Place 1 spray into both nostrils daily as needed for allergies.     [provider]  montelukast (SINGULAIR) 10 MG tablet Take 10 mg by mouth daily.     [provider]  Multiple Vitamins-Minerals (WOMENS MULTIVITAMIN PO) Take 1 tablet by mouth daily.     [provider]  naproxen sodium (ALEVE) 220 MG tablet Take 220 mg by mouth daily.    [provider]  pantoprazole (PROTONIX) 40 MG tablet Take 40 mg by mouth every morning.     [provider]  PARoxetine (PAXIL) 20 MG tablet Take 20 mg by mouth every morning.     [provider]  Propylene Glycol (SYSTANE BALANCE) 0.6 % SOLN Place 1-2 drops into both eyes 3 (three) times daily as needed (dry eyes).    [provider]  traMADol (ULTRAM) 50 MG tablet Take 2 tablets (100 mg total) by mouth every 6 (six) hours as needed. 06/01/17   Franky MachoJenkins, Mark, MD    Family History Family History  Problem Relation Age of Onset  . Heart attack Father   . Other Father        heart valve surgery  . Heart attack Mother   . Seizures Sister   . Breast cancer Sister   .  Hypertension Sister     Social History Social History   Tobacco Use  . Smoking status: Never Smoker  . Smokeless tobacco: Never Used  Substance Use Topics  . Alcohol use: Yes    Alcohol/week: 0.0 - 1.0 standard drinks    Comment: rarely drinks alcohol  . Drug use: No     Allergies   Codeine, Levofloxacin, Lipitor [atorvastatin calcium], and Statins   Review of Systems Review of Systems  All other systems reviewed and are negative.    Physical Exam Updated Vital Signs BP (!) 162/106 (BP Location: Right Arm)   Pulse 79   Temp 97.6 F (36.4 C) (Oral)   Resp 16   Ht 5\' 5"  (9.024 m)   Wt 117.9 kg   SpO2 95%   BMI 43.27 kg/m   Physical Exam Vitals signs and nursing note reviewed.    63 year old female, resting comfortably and in no acute  distress. Vital signs are significant for elevated blood pressure. Oxygen saturation is 95%, which is normal. Head is normocephalic and atraumatic. PERRLA, EOMI. Oropharynx is clear. Neck is nontender and supple without adenopathy or JVD. Back is nontender and there is no CVA tenderness. Lungs are clear without rales, wheezes, or rhonchi. Chest is nontender. Heart has regular rate and rhythm without murmur. Abdomen is soft, flat, nontender without masses or hepatosplenomegaly and peristalsis is normoactive. Extremities: Ecchymosis noted on the radial aspect of the distal left forearm.  There is no swelling in the wrist and there is no tenderness over the anatomic snuffbox and no tenderness with axial compression of the thumb.  Mild tenderness is present rather diffusely through the left upper arm and elbow region without any point tenderness. Skin is warm and dry without rash. Neurologic: Mental status is normal, cranial nerves are intact, there are no motor or sensory deficits.  ED Treatments / Results   Radiology Dg Forearm Left  Result Date: 05/06/2019 CLINICAL DATA:  Fall with injury, medial proximal forearm EXAM: LEFT FOREARM - 2 VIEW COMPARISON:  None. FINDINGS: The radius and ulna are intact. Tiny ossific fragment adjacent the lateral epicondyle of the distal humerus may reflect small avulsion fracture or chronic enthesopathic change. Trace left elbow joint effusion. IMPRESSION: Small left elbow joint effusion with ossific fragment adjacent the lateral epicondyle of the distal humerus may reflect small avulsion fracture or enthesopathic change, correlate with point tenderness. Electronically Signed   By: Lovena Le M.D.   On: 05/06/2019 22:28   Dg Wrist Complete Left  Result Date: 05/06/2019 CLINICAL DATA:  Fall, pain and bruising medial left wrist EXAM: LEFT WRIST - COMPLETE 3+ VIEW COMPARISON:  None. FINDINGS: Soft tissue swelling adjacent the radial styloid without subjacent osseous  injury, acute fracture or traumatic malalignment. No significant degenerative changes. Remaining soft tissues are unremarkable. IMPRESSION: Soft tissue swelling adjacent to the radial styloid without acute osseous abnormality. Electronically Signed   By: Lovena Le M.D.   On: 05/06/2019 22:26   Dg Shoulder Left  Result Date: 05/06/2019 CLINICAL DATA:  Fall today with left shoulder pain. EXAM: LEFT SHOULDER - 2+ VIEW COMPARISON:  None. FINDINGS: There is no evidence of fracture or dislocation. There is no evidence of arthropathy or other focal bone abnormality. Soft tissues are unremarkable. IMPRESSION: Negative radiographs of the left shoulder. Electronically Signed   By: Keith Rake M.D.   On: 05/06/2019 22:28    Procedures Procedures   Medications Ordered in ED Medications - No data to display  Initial Impression / Assessment and Plan / ED Course  I have reviewed the triage vital signs and the nursing notes.  Pertinent labs & imaging results that were available during my care of the patient were reviewed by me and considered in my medical decision making (see chart for details).  Fall with soft tissue injury to left arm.  X-rays show questionable avulsion fracture in the region of the lateral epicondyle, but there is no point tenderness over that area nor is there any significant swelling.  No actual real tenderness in the region of the wrist, so very low index of suspicion for occult scaphoid fracture.  Patient is advised of these findings.  She is given a cock-up wrist splint to use as needed, advised ice and elevation and told to use over-the-counter analgesics as needed for pain.  Follow-up with PCP.  Also, blood pressure was elevated, recommended follow-up blood pressure measurement as an outpatient.  Final Clinical Impressions(s) / ED Diagnoses   Final diagnoses:  Fall from slipping, initial encounter  Contusion of left forearm, initial encounter  Elevated blood-pressure  reading, without diagnosis of hypertension    ED Discharge Orders    None       Dione BoozeGlick, Neosha Switalski, MD 05/07/19 [phone]

## 2019-05-18 ENCOUNTER — Ambulatory Visit (INDEPENDENT_AMBULATORY_CARE_PROVIDER_SITE_OTHER): Payer: 59 | Admitting: Physician Assistant

## 2019-05-18 ENCOUNTER — Encounter: Payer: Self-pay | Admitting: Physician Assistant

## 2019-05-18 ENCOUNTER — Other Ambulatory Visit: Payer: Self-pay

## 2019-05-18 VITALS — Ht 65.0 in | Wt 260.0 lb

## 2019-05-18 DIAGNOSIS — S63502A Unspecified sprain of left wrist, initial encounter: Secondary | ICD-10-CM | POA: Diagnosis not present

## 2019-05-18 DIAGNOSIS — M25522 Pain in left elbow: Secondary | ICD-10-CM | POA: Diagnosis not present

## 2019-05-18 NOTE — Progress Notes (Signed)
Office Visit Note   Patient: Renee Klein           Date of Birth: 08/08/1956           MRN: [id number] Visit Date: 05/18/2019              Requested by: Barbette HairHenderson, William W IV, MD [address] [place],  [place] [id number] PCP: Barbette HairHenderson, William W IV, MD   Assessment & Plan: Visit Diagnoses:  1. Wrist sprain, left, initial encounter   2. Pain in left elbow     Plan: Place her in a thumb spica wrist splint which she will wear for the next 2 weeks and coming out of it for gentle range of motion the hand wrist forearm.  She will obtain Voltaren gel and apply this 3 times daily to the wrist over the first extensor compartment region.  She will continue work on range of motion elbow.  She will follow-up with us in 2 weeks if she is not trending towards improvement.  Reassurance was given that this should resolve but may take up to 2 months to completely resolve.  Follow-Up Instructions: Return if symptoms worsen or fail to improve.   Orders:  No orders of the defined types were placed in this encounter.  No orders of the defined types were placed in this encounter.     Procedures: No procedures performed   Clinical Data: No additional findings.   Subjective: Chief Complaint  Patient presents with   Left Hand - Pain    DOI 05/07/2019   Left Elbow - Pain    DOI 05/07/2019    HPI [name] 63 year old female well-known to Dr. Magnus IvanBlackman service comes in today status post fall on 05/07/2019.  She reports that she was carrying some white table causing just had a mechanical fall coming down on her left elbow and wrist.  [facility] where radiographs were obtained of her left wrist, left shoulder and left forearm.  Personally reviewed these.  There is a small calcification off the lateral epicondyle of the left elbow that could be indicative of a evulsion fracture.  Otherwise left elbow is well located.  No other fractures or bony abnormalities.  Calcification is  seen at the triceps insertion this appears chronic. Left wrist multiple views showed no acute fracture or bony abnormalities.  Carpal bones alignment overall well-maintained. Left shoulder 3 views showed no acute findings. She has been taking Aleve which helps some.  She does note some numbness in her hand and wrist.  She denies any injury otherwise.  Denies any dizziness loss of consciousness.  Review of Systems See HPI otherwise negative  Objective: Vital Signs: Ht 5\' 5"  (1.651 m)    Wt 260 lb (117.9 kg)    BMI 43.27 kg/m   Physical Exam Constitutional:      Appearance: She is not ill-appearing or diaphoretic.  Cardiovascular:     Pulses: Normal pulses.  Pulmonary:     Effort: Pulmonary effort is normal.  Neurological:     Mental Status: She is alert and oriented to person, place, and time.     Ortho Exam Bilateral hips full range of motion without significant pain.  She has full supination pronation bilateral forearms.  Good range of motion bilateral elbows.  Full motor bilateral hands.  No malrotation of the phalangeal metacarpal joints.  With extension of the left thumb she does have some discomfort in the first extensor compartment but  is able to bring the thumb up fully.  Nontender over the snuffbox.  No laxity with stressing of the left thumb at extension and flexion 30 degrees.  She has full sensation throughout both hands. Left elbow she has slight tenderness of the lateral epicondyle region.  Otherwise elbow is nontender. She has significant ecchymosis volar aspect of the distal forearm and over the first extensor compartment of the left wrist.  Specialty Comments:  No specialty comments available.  Imaging: No results found.   PMFS History: Patient Active Problem List   Diagnosis Date Noted   Primary osteoarthritis of right knee 04/07/2019   Primary osteoarthritis of left knee 04/07/2019   Calculus of gallbladder with acute cholecystitis without obstruction     Past Medical History:  Diagnosis Date   Arthritis    Asthma    Concussion    Depression    Endometriosis    GERD (gastroesophageal reflux disease)    Heart murmur    Hypercholesterolemia     Family History  Problem Relation Age of Onset   Heart attack Father    Other Father        heart valve surgery   Heart attack Mother    Seizures Sister    Breast cancer Sister    Hypertension Sister     Past Surgical History:  Procedure Laterality Date   ABDOMINAL HYSTERECTOMY     bone scraping left thumb     BUNIONECTOMY Bilateral    CHOLECYSTECTOMY N/A 06/01/2017   Procedure: LAPAROSCOPIC CHOLECYSTECTOMY;  Surgeon: Aviva Signs, MD;  Location: [facility];  Service: General;  Laterality: N/A;   DIAGNOSTIC LAPAROSCOPY     endometriosis   KNEE ARTHROPLASTY Left    THUMB AMPUTATION Left    Scrape bone secondary to infection   TONSILLECTOMY     Social History   Occupational History   Occupation: Glass blower/designer  Tobacco Use   Smoking status: Never Smoker   Smokeless tobacco: Never Used  Substance and Sexual Activity   Alcohol use: Yes    Alcohol/week: 0.0 - 1.0 standard drinks    Comment: rarely drinks alcohol   Drug use: No   Sexual activity: Not on file

## 2019-08-18 ENCOUNTER — Other Ambulatory Visit: Payer: Self-pay

## 2019-08-18 ENCOUNTER — Encounter: Payer: Self-pay | Admitting: Physician Assistant

## 2019-08-18 ENCOUNTER — Ambulatory Visit: Payer: 59 | Admitting: Physician Assistant

## 2019-08-18 DIAGNOSIS — M1711 Unilateral primary osteoarthritis, right knee: Secondary | ICD-10-CM | POA: Diagnosis not present

## 2019-08-18 DIAGNOSIS — M1712 Unilateral primary osteoarthritis, left knee: Secondary | ICD-10-CM | POA: Diagnosis not present

## 2019-08-18 MED ORDER — LIDOCAINE HCL 1 % IJ SOLN
0.5000 mL | INTRAMUSCULAR | Status: AC | PRN
  Administered 2019-08-18: 17:00:00 .5 mL

## 2019-08-18 MED ORDER — METHYLPREDNISOLONE ACETATE 40 MG/ML IJ SUSP
40.0000 mg | INTRAMUSCULAR | Status: AC | PRN
  Administered 2019-08-18: 17:00:00 40 mg via INTRA_ARTICULAR

## 2019-08-18 MED ORDER — METHYLPREDNISOLONE ACETATE 40 MG/ML IJ SUSP
40.0000 mg | INTRAMUSCULAR | Status: AC | PRN
  Administered 2019-08-18: 40 mg via INTRA_ARTICULAR

## 2019-08-18 NOTE — Progress Notes (Signed)
   Procedure Note  Patient: Renee Klein             Date of Birth: 1956/01/17           MRN: [id number]             Visit Date: 08/18/2019  HPI: [name] returns today for bilateral knee pain.  She is requesting cortisone injections both knees.  Left knee is bothering her more than the right.  She has known osteoarthritis of both knees.  Last had supplemental injections both knees August of this year.  Said no new injury to either knee.  Bilateral knees overall good range of motion.  No abnormal warmth erythema. Procedures: Visit Diagnoses:  1. Primary osteoarthritis of left knee   2. Primary osteoarthritis of right knee     Large Joint Inj: bilateral knee on 08/18/2019 5:16 PM Indications: pain Details: 22 G 1.5 in needle, anterolateral approach  Arthrogram: No  Medications (Right): 0.5 mL lidocaine 1 %; 40 mg methylPREDNISolone acetate 40 MG/ML Medications (Left): 0.5 mL lidocaine 1 %; 40 mg methylPREDNISolone acetate 40 MG/ML Outcome: tolerated well, no immediate complications Procedure, treatment alternatives, risks and benefits explained, specific risks discussed. Consent was given by the patient. Immediately prior to procedure a time out was called to verify the correct patient, procedure, equipment, support staff and site/side marked as required. Patient was prepped and draped in the usual sterile fashion.    Plan: We will see her back on as-needed basis.  Questions encouraged and answered she knows to wait at least 3 months between cortisone injections in 6 months between supplemental injections in the knees.

## 2019-12-02 ENCOUNTER — Telehealth: Payer: Self-pay | Admitting: Physician Assistant

## 2019-12-02 NOTE — Telephone Encounter (Signed)
Can we order this for her Bilateral knee

## 2019-12-02 NOTE — Telephone Encounter (Signed)
Last injection was in July ok to to proceed  Submitted for VOB for Synvisc One- Bilateral knee

## 2019-12-02 NOTE — Telephone Encounter (Signed)
Patient would like Gel injections at her appointment. Wanted Bronson Curb to know so he could get Auth from [employer].

## 2019-12-06 ENCOUNTER — Telehealth: Payer: Self-pay

## 2019-12-06 NOTE — Telephone Encounter (Signed)
Approved for Synvisc One-Bilateral knee Dr. Kathrin Greathouse and Bill [phone] copay then covered @ 100% Called [employer] and verified original Berkley Harvey is still good Auth # [id number] Dates: 03/21/19-03/19/20

## 2019-12-06 NOTE — Telephone Encounter (Signed)
Pt informed and stated understanding to approval and copay I did inform pt that auth can take up to two weeks if she has to have another injection in 6 months. She stated understanding

## 2019-12-07 ENCOUNTER — Other Ambulatory Visit: Payer: Self-pay

## 2019-12-07 ENCOUNTER — Ambulatory Visit: Payer: 59 | Admitting: Physician Assistant

## 2019-12-07 ENCOUNTER — Ambulatory Visit (INDEPENDENT_AMBULATORY_CARE_PROVIDER_SITE_OTHER): Payer: 59 | Admitting: Physician Assistant

## 2019-12-07 DIAGNOSIS — M1711 Unilateral primary osteoarthritis, right knee: Secondary | ICD-10-CM | POA: Diagnosis not present

## 2019-12-07 MED ORDER — HYLAN G-F 20 48 MG/6ML IX SOSY
48.0000 mg | PREFILLED_SYRINGE | INTRA_ARTICULAR | Status: AC | PRN
  Administered 2019-12-07: 48 mg via INTRA_ARTICULAR

## 2019-12-07 MED ORDER — LIDOCAINE HCL 1 % IJ SOLN
5.0000 mL | INTRAMUSCULAR | Status: AC | PRN
  Administered 2019-12-07: 5 mL
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
## 2020-01-18 ENCOUNTER — Other Ambulatory Visit: Payer: Self-pay

## 2020-01-18 ENCOUNTER — Ambulatory Visit: Payer: 59 | Admitting: Physician Assistant

## 2020-01-18 ENCOUNTER — Encounter: Payer: Self-pay | Admitting: Physician Assistant

## 2020-01-18 DIAGNOSIS — M1712 Unilateral primary osteoarthritis, left knee: Secondary | ICD-10-CM

## 2020-01-18 MED ORDER — HYLAN G-F 20 48 MG/6ML IX SOSY
48.0000 mg | PREFILLED_SYRINGE | INTRA_ARTICULAR | Status: AC | PRN
  Administered 2020-01-18: 48 mg via INTRA_ARTICULAR
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
## 2020-03-26 ENCOUNTER — Other Ambulatory Visit: Payer: Self-pay

## 2020-03-26 ENCOUNTER — Ambulatory Visit (INDEPENDENT_AMBULATORY_CARE_PROVIDER_SITE_OTHER): Payer: No Typology Code available for payment source

## 2020-03-26 ENCOUNTER — Telehealth: Payer: Self-pay | Admitting: Radiology

## 2020-03-26 ENCOUNTER — Ambulatory Visit: Payer: 59 | Admitting: Orthopaedic Surgery

## 2020-03-26 DIAGNOSIS — M25562 Pain in left knee: Secondary | ICD-10-CM

## 2020-03-26 DIAGNOSIS — M1712 Unilateral primary osteoarthritis, left knee: Secondary | ICD-10-CM

## 2020-03-26 MED ORDER — METHYLPREDNISOLONE ACETATE 40 MG/ML IJ SUSP
40.0000 mg | INTRAMUSCULAR | Status: AC | PRN
  Administered 2020-03-26: 40 mg via INTRA_ARTICULAR

## 2020-03-26 MED ORDER — LIDOCAINE HCL 1 % IJ SOLN
3.0000 mL | INTRAMUSCULAR | Status: AC | PRN
  Administered 2020-03-26: 3 mL
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
## 2020-03-26 NOTE — Telephone Encounter (Signed)
Correction spoke with Andres Labrum

## 2020-03-26 NOTE — Telephone Encounter (Signed)
Velora Heckler from [facility] spoke with Rexene Edison, PA-C, informed patient is clear for surgery.

## 2020-03-27 ENCOUNTER — Other Ambulatory Visit: Payer: Self-pay

## 2020-03-27 DIAGNOSIS — M25562 Pain in left knee: Secondary | ICD-10-CM

## 2020-04-03 ENCOUNTER — Ambulatory Visit
Admission: RE | Admit: 2020-04-03 | Discharge: 2020-04-03 | Disposition: A | Discharge status: Home / Self Care | Payer: No Typology Code available for payment source | Source: Ambulatory Visit | Attending: Orthopaedic Surgery | Admitting: Orthopaedic Surgery

## 2020-04-03 DIAGNOSIS — M25562 Pain in left knee: Secondary | ICD-10-CM

## 2020-04-10 ENCOUNTER — Ambulatory Visit (INDEPENDENT_AMBULATORY_CARE_PROVIDER_SITE_OTHER): Payer: No Typology Code available for payment source | Admitting: Orthopaedic Surgery

## 2020-04-10 ENCOUNTER — Telehealth: Payer: Self-pay

## 2020-04-10 ENCOUNTER — Encounter: Payer: Self-pay | Admitting: Orthopaedic Surgery

## 2020-04-10 ENCOUNTER — Other Ambulatory Visit: Payer: Self-pay

## 2020-04-10 DIAGNOSIS — G8929 Other chronic pain: Secondary | ICD-10-CM | POA: Diagnosis not present

## 2020-04-10 DIAGNOSIS — S83242D Other tear of medial meniscus, current injury, left knee, subsequent encounter: Secondary | ICD-10-CM | POA: Diagnosis not present

## 2020-04-10 DIAGNOSIS — M25562 Pain in left knee: Secondary | ICD-10-CM | POA: Diagnosis not present

## 2020-04-10 DIAGNOSIS — M1712 Unilateral primary osteoarthritis, left knee: Secondary | ICD-10-CM
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
## 2020-04-10 NOTE — Telephone Encounter (Signed)
Left knee gel injection ?

## 2020-04-10 NOTE — Telephone Encounter (Signed)
Noted  

## 2020-04-13 ENCOUNTER — Telehealth: Payer: Self-pay

## 2020-04-13 NOTE — Telephone Encounter (Signed)
Will submit for gel injection for left knee in October, 2021 due to patient receiving SynviscOne, left knee on 01/18/2020.  Next available injection will need to be after 07/20/2020, it will be 6 months after that date.

## 2020-04-16 ENCOUNTER — Telehealth: Payer: Self-pay | Admitting: Orthopaedic Surgery

## 2020-04-16 NOTE — Telephone Encounter (Signed)
Pt was informed and stated she is confused. She thought she was getting a different injection completely not just a different brand of the gel injection that hasnt helped her. She would like a call back from Dr. Eliberto Ivory team to discuss this

## 2020-04-16 NOTE — Telephone Encounter (Signed)
Please advise below.

## 2020-04-16 NOTE — Telephone Encounter (Signed)
See below. Please also see note April put in on 07/30. Can you please call pt and advise?

## 2020-04-16 NOTE — Telephone Encounter (Signed)
Last gel injection for left knee was completed on 01/18/2020.  Next available injection will need to be after 07/20/2020, it will be 6 months after that date. Please advise on what pt can do until November

## 2020-04-16 NOTE — Telephone Encounter (Signed)
The only other treatment options for a painful arthritic knee with the repeating steroid injection at any time which we can do.  She can also try anti-inflammatory such as Aleve and over-the-counter supplements such as glucosamine or turmeric.

## 2020-04-16 NOTE — Telephone Encounter (Signed)
Please see Dr. Eliberto Ivory response.

## 2020-04-16 NOTE — Telephone Encounter (Signed)
Pt called wanting to  Know if we heard back from her insurance regarding the gel injections?  [phone]

## 2020-04-17 NOTE — Telephone Encounter (Signed)
Spoke with patient. She was under the impression that brochures on Durolane and Monovisc were a completely different type of injection.  I explained they are different brand however at still gel injections. That with insurance even though the brand is different its still a gel injection and will not be covered until 6 months after initial injection.  Advised patient can still have steroid injections in between the gel injections however those have to be 3 months apart from initial steroid injection. Patient stated she understood.

## 2020-06-25 ENCOUNTER — Encounter: Payer: Self-pay | Admitting: Physician Assistant

## 2020-06-25 ENCOUNTER — Telehealth: Payer: Self-pay | Admitting: Radiology

## 2020-06-25 ENCOUNTER — Ambulatory Visit: Payer: No Typology Code available for payment source | Admitting: Physician Assistant

## 2020-06-25 DIAGNOSIS — M1712 Unilateral primary osteoarthritis, left knee: Secondary | ICD-10-CM | POA: Diagnosis not present

## 2020-06-25 MED ORDER — LIDOCAINE HCL 1 % IJ SOLN
3.0000 mL | INTRAMUSCULAR | Status: AC | PRN
  Administered 2020-06-25: 3 mL

## 2020-06-25 MED ORDER — METHYLPREDNISOLONE ACETATE 40 MG/ML IJ SUSP
40.0000 mg | INTRAMUSCULAR | Status: AC | PRN
  Administered 2020-06-25: 40 mg via INTRA_ARTICULAR

## 2020-06-25 NOTE — Telephone Encounter (Signed)
Next available injection will need to be after 07/20/2020, it will be 6 months after that date, but will go ahead and submit for VOB.

## 2020-06-25 NOTE — Telephone Encounter (Signed)
Noted  

## 2020-06-25 NOTE — Progress Notes (Signed)
   Procedure Note  Patient: Suriya Kovarik             Date of Birth: 05-29-1956           MRN: [id number]             Visit Date: 06/25/2020 HPI: Ms. Noel Gerold comes in today present left knee injection she is waiting on supplemental injection to be approved.  She states her left knee pain is becoming worse.  She had difficulty transitioning from sitting position to walking due to the pain in the left knee.  No known injury.  She has a known degenerative meniscal tear and arthritic changes of the left knee.  Physical exam: Left knee full extension flexion beyond 90 degrees.  Tenderness along medial lateral joint line.  No abnormal warmth erythema or effusion. Procedures: Visit Diagnoses:  1. Primary osteoarthritis of left knee     Large Joint Inj on 06/25/2020 8:49 AM Indications: pain Details: 22 G 1.5 in needle, anterolateral approach  Arthrogram: No  Medications: 3 mL lidocaine 1 %; 40 mg methylPREDNISolone acetate 40 MG/ML Outcome: tolerated well, no immediate complications Procedure, treatment alternatives, risks and benefits explained, specific risks discussed. Consent was given by the patient. Immediately prior to procedure a time out was called to verify the correct patient, procedure, equipment, support staff and site/side marked as required. Patient was prepped and draped in the usual sterile fashion.     Plan: She will follow up with [place] as needed.  She will we will continue to work on obtaining supplemental injection for her and have her return when this is available.  She knows to wait at least 3 months between cortisone injections.  Questions were encouraged and answered

## 2020-06-25 NOTE — Telephone Encounter (Signed)
Patient ready for left knee gel injection.  See previous messages.

## 2020-07-05 ENCOUNTER — Telehealth: Payer: Self-pay | Admitting: Physician Assistant

## 2020-07-05 NOTE — Telephone Encounter (Signed)
Pt would like to get approved for a gel injection in her L knee; pt had a cortisone injection 06/25/20. Pt would like to get scheduled before 07/17/20 if possible  [phone]

## 2020-07-05 NOTE — Telephone Encounter (Signed)
April you submitted this one

## 2020-07-06 ENCOUNTER — Telehealth: Payer: Self-pay

## 2020-07-06 NOTE — Telephone Encounter (Signed)
Noted. Will submit. Patient's next available injection will need to be after 07/20/2020 due to last gel injection on 01/18/2020.

## 2020-07-06 NOTE — Telephone Encounter (Signed)
Submitted VOB, SynviscOne, left knee. 

## 2020-07-13 ENCOUNTER — Telehealth: Payer: Self-pay

## 2020-07-13 NOTE — Telephone Encounter (Signed)
PA required, SynviscOne, left knee. Faxed completed PA form to [place] at [phone].

## 2020-07-16 ENCOUNTER — Telehealth: Payer: Self-pay

## 2020-07-16 NOTE — Telephone Encounter (Signed)
Approved, SynviscOne, left knee. Buy & Bill Covered at 100% after Co-pay Co-pay of $80.00 required PA required PA Approval# [id number] Valid 07/13/2020- 07/12/2021  Appt. 07/23/2020 with Richardean Canal

## 2020-07-23 ENCOUNTER — Ambulatory Visit: Payer: No Typology Code available for payment source | Admitting: Physician Assistant

## 2020-07-23 ENCOUNTER — Encounter: Payer: Self-pay | Admitting: Physician Assistant

## 2020-07-23 DIAGNOSIS — M1712 Unilateral primary osteoarthritis, left knee: Secondary | ICD-10-CM

## 2020-07-23 MED ORDER — LIDOCAINE HCL 1 % IJ SOLN
3.0000 mL | INTRAMUSCULAR | Status: AC | PRN
  Administered 2020-07-23: 3 mL

## 2020-07-23 MED ORDER — HYLAN G-F 20 48 MG/6ML IX SOSY
48.0000 mg | PREFILLED_SYRINGE | INTRA_ARTICULAR | Status: AC | PRN
  Administered 2020-07-23: 48 mg via INTRA_ARTICULAR

## 2020-07-23 NOTE — Progress Notes (Signed)
   Procedure Note  Patient: Arilyn Brierley             Date of Birth: 06-Sep-1956           MRN: [id number]             Visit Date: 07/23/2020 HPI: Ms. Slattery is well-known to Dr. Magnus Ivan service has known osteoarthritis left knee comes in today for scheduled Synvisc 1 injection.  She has had no new injury to her knee.  Physical exam: Left knee no abnormal warmth erythema or effusion overall good range of motion the knee. Procedures: Visit Diagnoses:  1. Primary osteoarthritis of left knee     Large Joint Inj on 07/23/2020 4:05 PM Indications: pain Details: 22 G 1.5 in needle, anterolateral approach  Arthrogram: No  Medications: 3 mL lidocaine 1 %; 48 mg Hylan 48 MG/6ML Outcome: tolerated well, no immediate complications Procedure, treatment alternatives, risks and benefits explained, specific risks discussed. Consent was given by the patient. Immediately prior to procedure a time out was called to verify the correct patient, procedure, equipment, support staff and site/side marked as required. Patient was prepped and draped in the usual sterile fashion.     Plan: She will follow up with [place] on as-needed basis she knows to wait at least 6 months between supplemental injections.  Questions encouraged and answered.

## 2020-09-03 IMAGING — DX LEFT FOREARM - 2 VIEW
2 series · 2 of 2 positions shown · non-contrast
Comparison: None.

CLINICAL DATA: Fall with injury, medial proximal forearm

EXAM:
LEFT FOREARM - 2 VIEW

[forearm ap]
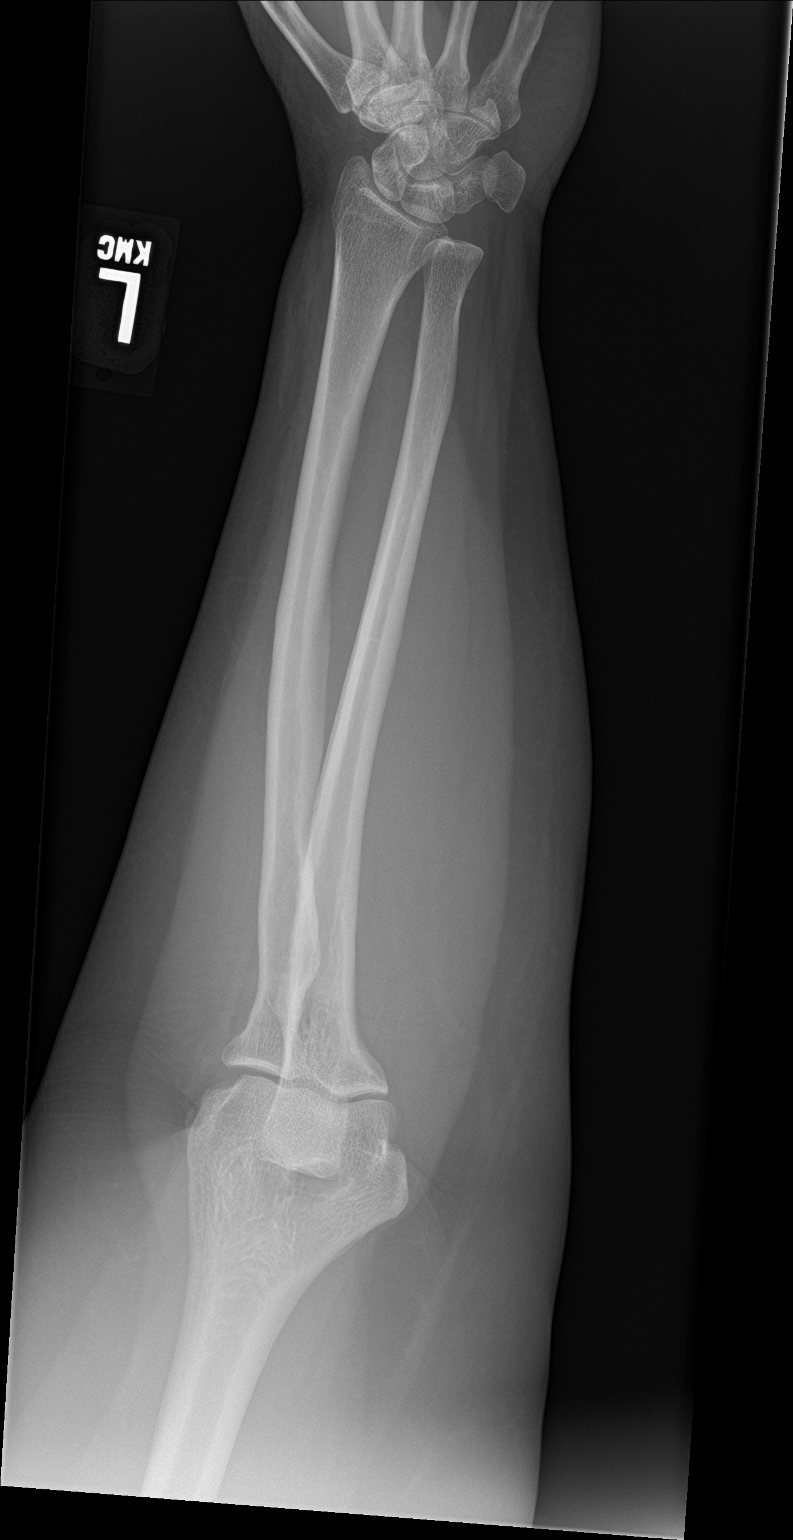

[forearm lat]
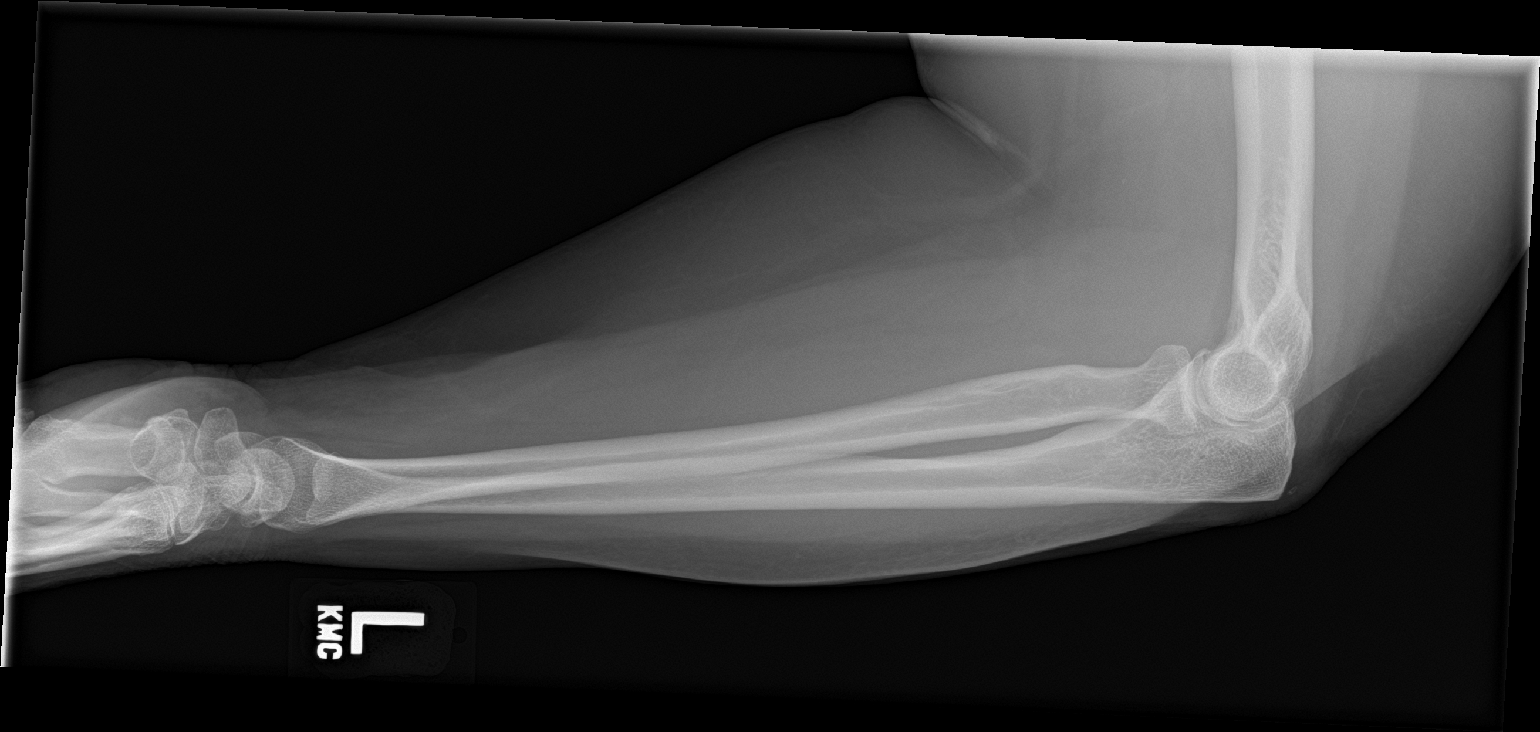

[2 of 2 positions shown; findings below may reference images not displayed]

FINDINGS: The radius and ulna are intact. Tiny ossific fragment adjacent the
lateral epicondyle of the distal humerus may reflect small avulsion
fracture or chronic enthesopathic change. Trace left elbow joint
effusion.
IMPRESSION: Small left elbow joint effusion with ossific fragment adjacent the
lateral epicondyle of the distal humerus may reflect small avulsion
fracture or enthesopathic change, correlate with point tenderness.

## 2020-09-03 IMAGING — DX LEFT WRIST - COMPLETE 3+ VIEW
4 series · 4 of 4 positions shown · non-contrast
Comparison: None.

CLINICAL DATA: Fall, pain and bruising medial left wrist

EXAM:
LEFT WRIST - COMPLETE 3+ VIEW

[wrist pa]
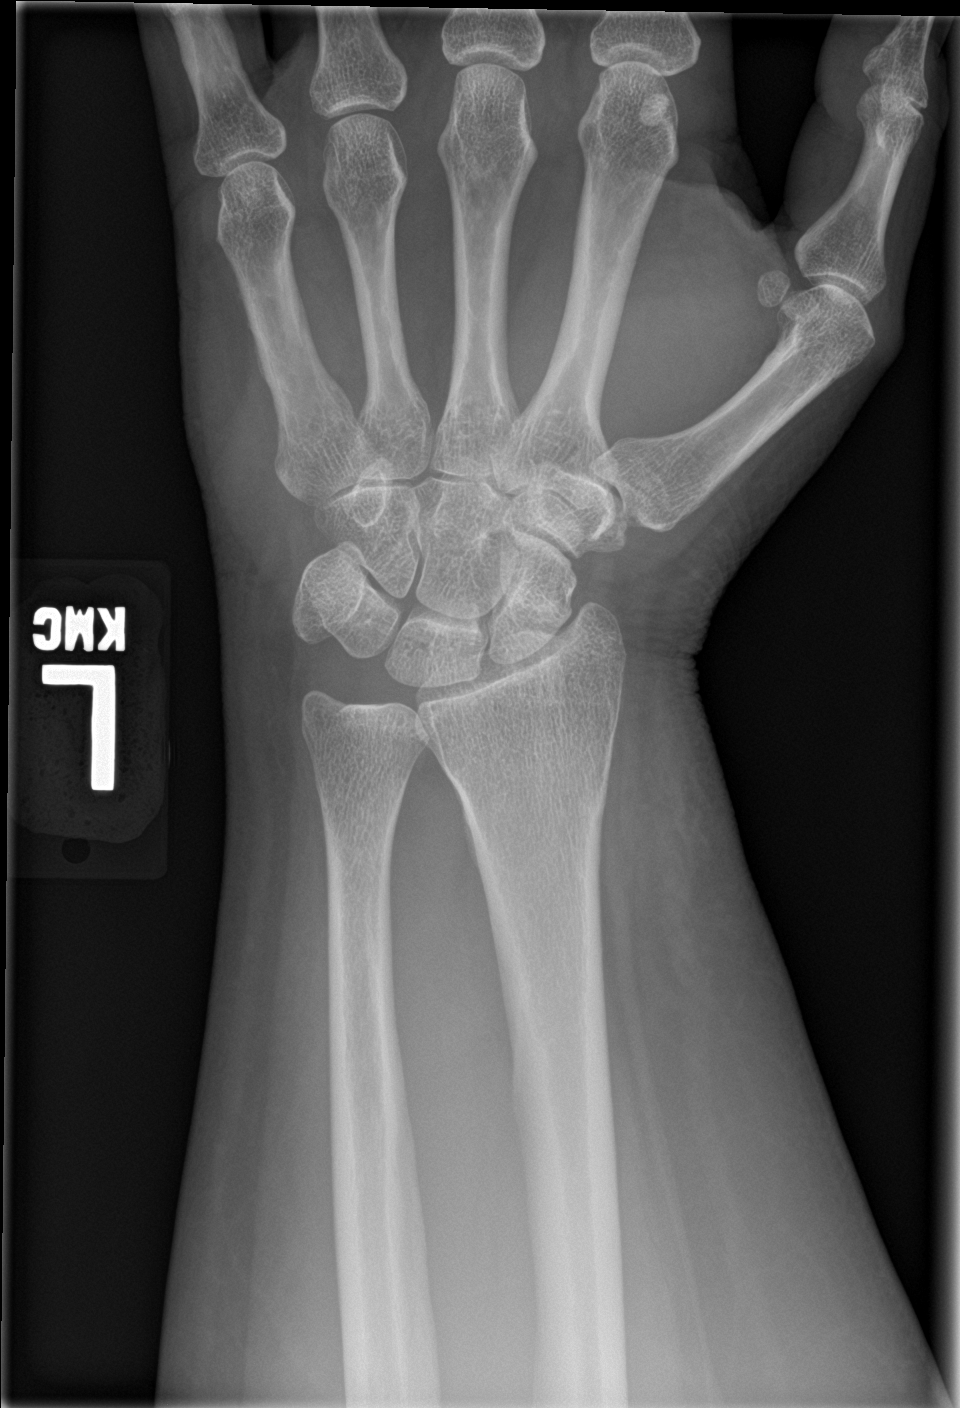

[wrist obl]
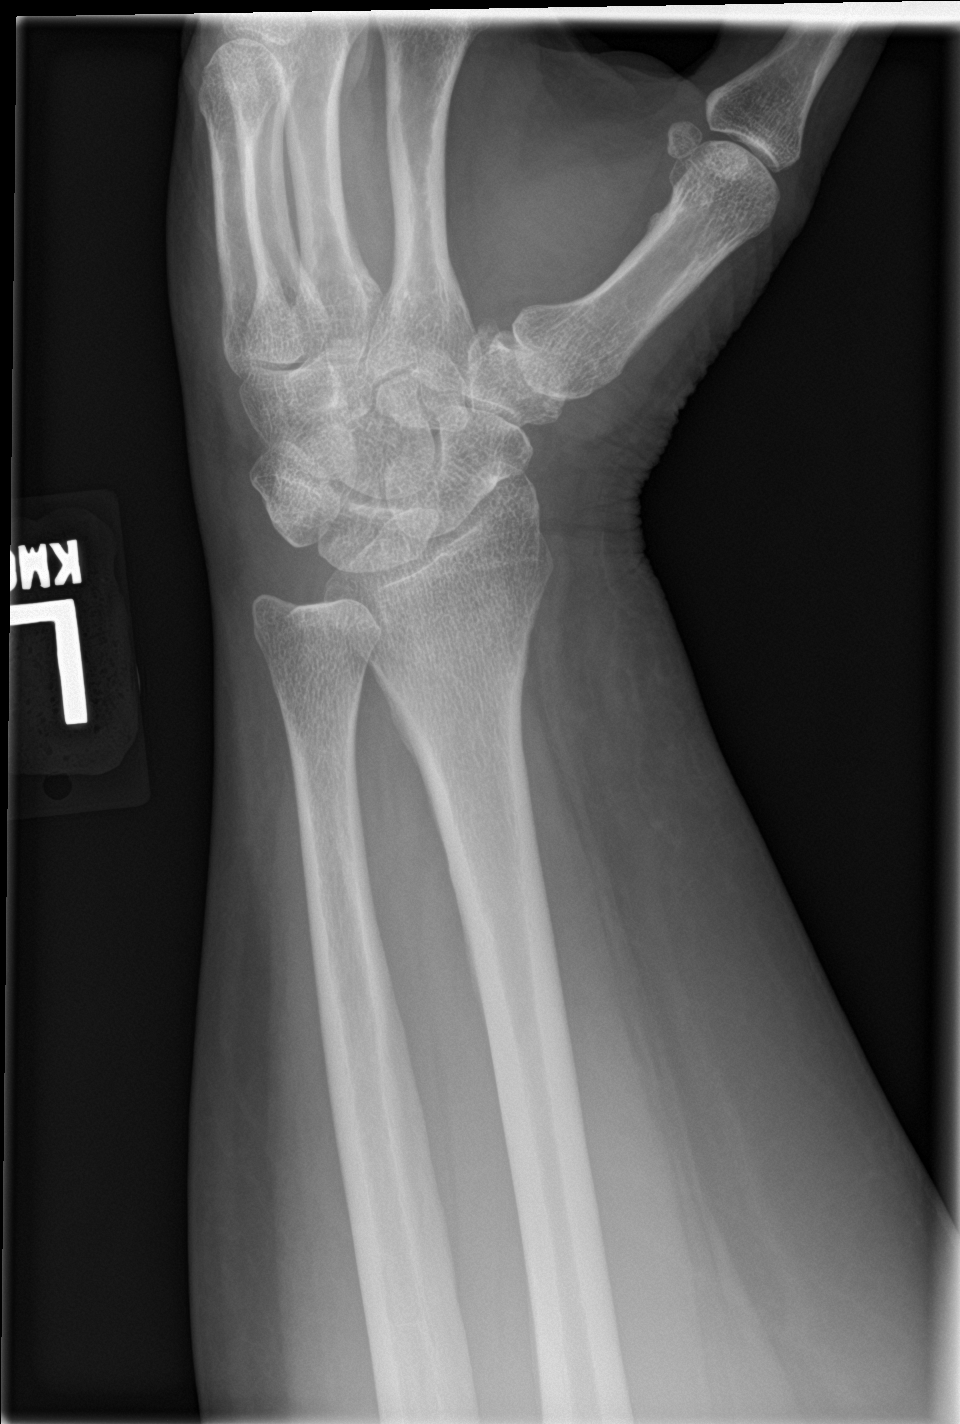

[wrist lat]
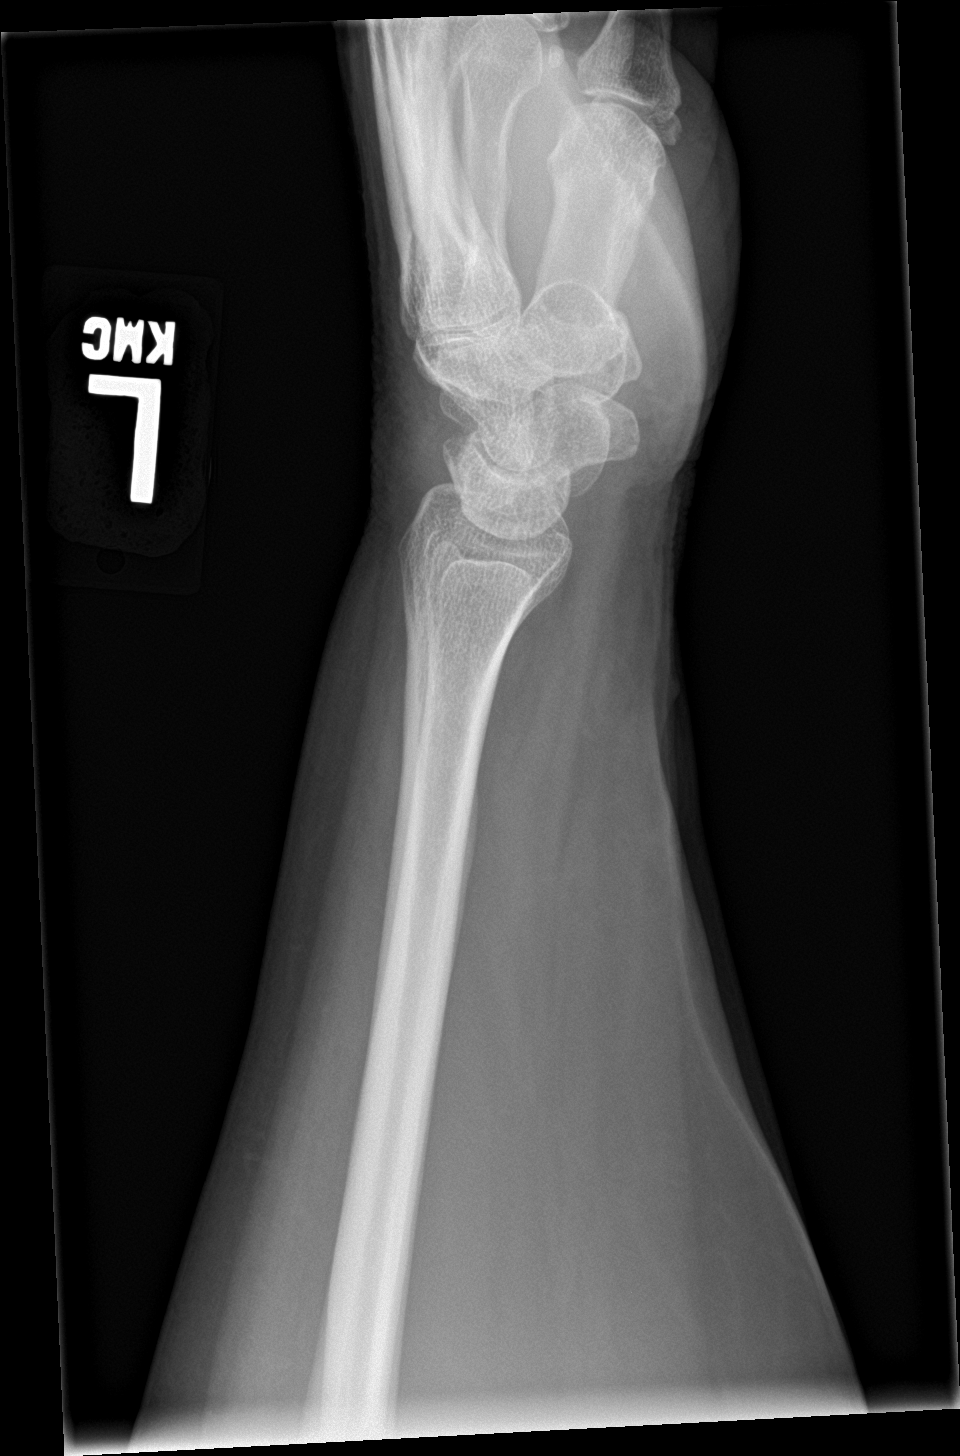

[wrist navicular]
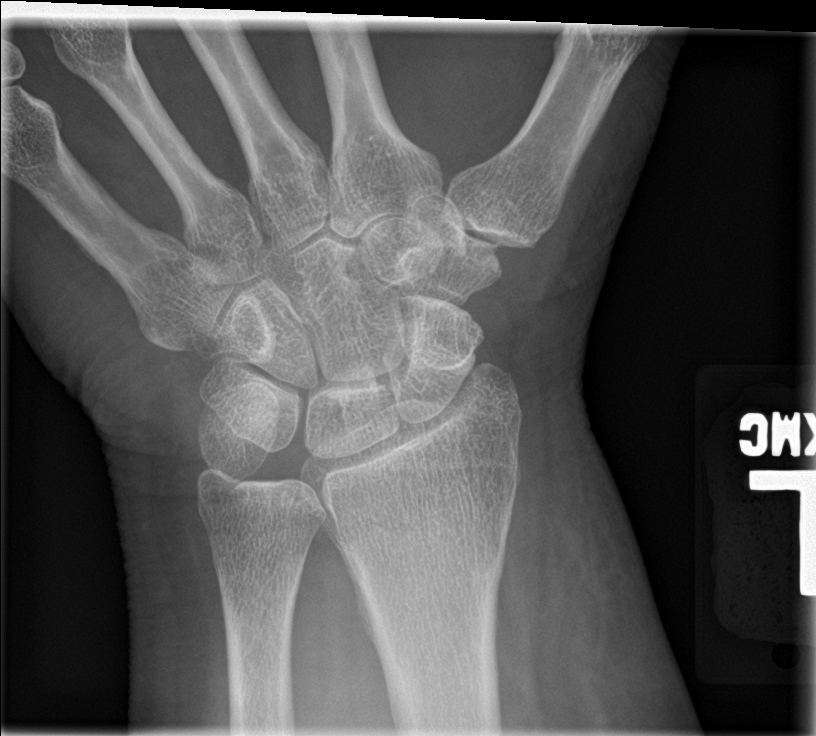

[4 of 4 positions shown; findings below may reference images not displayed]

FINDINGS: Soft tissue swelling adjacent the radial styloid without subjacent
osseous injury, acute fracture or traumatic malalignment. No
significant degenerative changes. Remaining soft tissues are
unremarkable.
IMPRESSION: Soft tissue swelling adjacent to the radial styloid without acute
osseous abnormality.

## 2021-01-25 ENCOUNTER — Telehealth: Payer: Self-pay | Admitting: Physician Assistant

## 2021-01-25 NOTE — Telephone Encounter (Signed)
Patient husband called requesting to resubmit gel injections for left knee of SynviscOne. Please call once approved and schedule appt. Phone number is [phone]

## 2021-01-30 NOTE — Telephone Encounter (Signed)
Noted  

## 2021-02-08 ENCOUNTER — Telehealth: Payer: Self-pay

## 2021-02-08 ENCOUNTER — Telehealth: Payer: Self-pay | Admitting: Physician Assistant

## 2021-02-08 NOTE — Telephone Encounter (Signed)
Pt's husband called following up on previous call  About a gel injection with [facility] for both himself and his wife, pt stated he would like a call if there was anything on his end that he needed to do to get the process moving. Pt is anxious because they will be leaving for a cruise July 6th and wanted to make sure they were able to walk for it. The best call back number is [phone].

## 2021-02-08 NOTE — Telephone Encounter (Signed)
Submitted VOb for Durolane, left knee. Pending BV.

## 2021-02-08 NOTE — Telephone Encounter (Signed)
Talked with patient's husband concerning gel injection and advised him that there was no insurance coverage in the chart.  Patient's advised me that insurance has changed and provided me with the new insurance. [id number] ID Group# [id number]

## 2021-02-12 ENCOUNTER — Telehealth: Payer: Self-pay

## 2021-02-12 NOTE — Telephone Encounter (Signed)
Approved for Durolane, left knee Buy & Bill Must meet deductible first Patient will be responsible for 20% OOP. Co-pay of $60.00 No PA required  Appt.02/18/2021 with Richardean Canal

## 2021-02-18 ENCOUNTER — Ambulatory Visit: Payer: Self-pay | Admitting: Physician Assistant

## 2021-03-04 ENCOUNTER — Encounter: Payer: Self-pay | Admitting: Physician Assistant

## 2021-03-04 ENCOUNTER — Other Ambulatory Visit: Payer: Self-pay

## 2021-03-04 ENCOUNTER — Ambulatory Visit: Payer: 59 | Admitting: Physician Assistant

## 2021-03-04 DIAGNOSIS — M65341 Trigger finger, right ring finger: Secondary | ICD-10-CM | POA: Diagnosis not present

## 2021-03-04 DIAGNOSIS — M1712 Unilateral primary osteoarthritis, left knee: Secondary | ICD-10-CM

## 2021-03-04 MED ORDER — SODIUM HYALURONATE 60 MG/3ML IX PRSY
60.0000 mg | PREFILLED_SYRINGE | INTRA_ARTICULAR | Status: AC | PRN
  Administered 2021-03-04: 60 mg via INTRA_ARTICULAR

## 2021-03-04 MED ORDER — METHYLPREDNISOLONE ACETATE 40 MG/ML IJ SUSP
0.5000 mL | INTRAMUSCULAR | Status: AC | PRN
  Administered 2021-03-04: .5 mL

## 2021-03-04 MED ORDER — LIDOCAINE HCL 1 % IJ SOLN
0.5000 mL | INTRAMUSCULAR | Status: AC | PRN
  Administered 2021-03-04: .5 mL

## 2021-03-04 NOTE — Progress Notes (Signed)
   Procedure Note  Patient: Renee Klein             Date of Birth: [birth year]           MRN: [id number]             Visit Date: 03/04/2021  HPI: Renee Klein comes in today for scheduled left knee Durolane injection.  She had 9 new injury left knee.  She has known osteoarthritis of the left knee.  Radiographs show tricompartmental arthritic changes with periarticular osteophytes and varus malalignment.  She has failed conservative treatment.  She does not have a total knee replacement scheduled for the next 6 months. Patient also notes that she is having triggering of her left ring finger.  This began after an incident where her bearded dragon tried to swallow a penny and she was trying to get it away from the reptile.  She notes the finger gets stuck down at times.  It impedes her typing.   Physical exam: Left knee good range of motion without pain.  No abnormal warmth erythema or effusion Left ring finger active triggering.  Tenderness over the A1 pulley where there is a palpable nodule.  Procedures: Visit Diagnoses:  1. Primary osteoarthritis of left knee   2. Trigger finger, right ring finger     Large Joint Inj: L knee on 03/04/2021 4:01 PM Indications: pain Details: 22 G 1.5 in needle, anterolateral approach  Arthrogram: No  Medications: 60 mg Sodium Hyaluronate 60 MG/3ML Outcome: tolerated well, no immediate complications Procedure, treatment alternatives, risks and benefits explained, specific risks discussed. Consent was given by the patient. Immediately prior to procedure a time out was called to verify the correct patient, procedure, equipment, support staff and site/side marked as required. Patient was prepped and draped in the usual sterile fashion.    Hand/UE Inj: L ring A1 for trigger finger on 03/04/2021 4:01 PM Details: volar approach Medications: 0.5 mL methylPREDNISolone acetate 40 MG/ML; 0.5 mL lidocaine 1 %    Plan: Patient knows to wait at least 6 months between  supplemental injections.  Regards to the trigger finger we will see how she does with this.  Discussed other options including Voltaren gel versus surgical intervention.  Patient opted for the injection today and tolerated the injection well.

## 2023-06-01 ENCOUNTER — Encounter (HOSPITAL_COMMUNITY): Payer: Self-pay | Admitting: *Deleted

## 2023-06-01 ENCOUNTER — Emergency Department: Payer: 59

## 2023-06-01 ENCOUNTER — Other Ambulatory Visit: Payer: Self-pay

## 2023-06-01 ENCOUNTER — Emergency Department (HOSPITAL_COMMUNITY): Payer: 59

## 2023-06-01 ENCOUNTER — Emergency Department (HOSPITAL_COMMUNITY)
Admission: EM | Admit: 2023-06-01 | Discharge: 2023-06-01 | Disposition: A | Discharge status: Home / Self Care | Payer: 59 | Attending: Emergency Medicine | Admitting: Emergency Medicine

## 2023-06-01 DIAGNOSIS — I4892 Unspecified atrial flutter: Secondary | ICD-10-CM

## 2023-06-01 DIAGNOSIS — J45909 Unspecified asthma, uncomplicated: Secondary | ICD-10-CM | POA: Insufficient documentation

## 2023-06-01 DIAGNOSIS — R079 Chest pain, unspecified: Secondary | ICD-10-CM | POA: Diagnosis present

## 2023-06-01 LAB — CBC WITH DIFFERENTIAL/PLATELET
Abs Immature Granulocytes: 0.01 10*3/uL (ref 0.00–0.07)
Basophils Absolute: 0.1 10*3/uL (ref 0.0–0.1)
Basophils Relative: 1 %
Eosinophils Absolute: 0.1 10*3/uL (ref 0.0–0.5)
Eosinophils Relative: 2 %
HCT: 43.8 % (ref 36.0–46.0)
Hemoglobin: 14.6 g/dL (ref 12.0–15.0)
Immature Granulocytes: 0 %
Lymphocytes Relative: 31 %
Lymphs Abs: 1.6 10*3/uL (ref 0.7–4.0)
MCH: 31.7 pg (ref 26.0–34.0)
MCHC: 33.3 g/dL (ref 30.0–36.0)
MCV: 95.2 fL (ref 80.0–100.0)
Monocytes Absolute: 0.6 10*3/uL (ref 0.1–1.0)
Monocytes Relative: 12 %
Neutro Abs: 2.7 10*3/uL (ref 1.7–7.7)
Neutrophils Relative %: 54 %
Platelets: 164 10*3/uL (ref 150–400)
RBC: 4.6 MIL/uL (ref 3.87–5.11)
RDW: 12.7 % (ref 11.5–15.5)
WBC: 5.1 10*3/uL (ref 4.0–10.5)
nRBC: 0 % (ref 0.0–0.2)

## 2023-06-01 LAB — COMPREHENSIVE METABOLIC PANEL
ALT: 37 U/L (ref 0–44)
AST: 25 U/L (ref 15–41)
Albumin: 3.4 g/dL — ABNORMAL LOW (ref 3.5–5.0)
Alkaline Phosphatase: 91 U/L (ref 38–126)
Anion gap: 9 (ref 5–15)
BUN: 16 mg/dL (ref 8–23)
CO2: 26 mmol/L (ref 22–32)
Calcium: 8.8 mg/dL — ABNORMAL LOW (ref 8.9–10.3)
Chloride: 105 mmol/L (ref 98–111)
Creatinine, Ser: 0.97 mg/dL (ref 0.44–1.00)
GFR, Estimated: >60 mL/min (ref >60)
Glucose, Bld: 111 mg/dL — ABNORMAL HIGH (ref 70–99)
Potassium: 4.1 mmol/L (ref 3.5–5.1)
Sodium: 140 mmol/L (ref 135–145)
Total Bilirubin: 0.7 mg/dL (ref 0.3–1.2)
Total Protein: 7 g/dL (ref 6.5–8.1)

## 2023-06-01 LAB — RAPID URINE DRUG SCREEN, HOSP PERFORMED
Amphetamines: NOT DETECTED
Barbiturates: NOT DETECTED
Benzodiazepines: NOT DETECTED
Cocaine: NOT DETECTED
Opiates: NOT DETECTED
Tetrahydrocannabinol: NOT DETECTED

## 2023-06-01 LAB — URINALYSIS, ROUTINE W REFLEX MICROSCOPIC
Bilirubin Urine: NEGATIVE
Glucose, UA: NEGATIVE mg/dL
Hgb urine dipstick: NEGATIVE
Ketones, ur: NEGATIVE mg/dL
Leukocytes,Ua: NEGATIVE
Nitrite: NEGATIVE
Protein, ur: NEGATIVE mg/dL
Specific Gravity, Urine: 1.012 (ref 1.005–1.030)
pH: 7 (ref 5.0–8.0)

## 2023-06-01 LAB — MAGNESIUM: Magnesium: 2 mg/dL (ref 1.7–2.4)

## 2023-06-01 MED ORDER — METOPROLOL TARTRATE 25 MG PO TABS: 12.5000 mg | ORAL_TABLET | Freq: Two times a day (BID) | ORAL | 2 refills | Status: DC

## 2023-06-01 MED ORDER — ONDANSETRON HCL 4 MG/2ML IJ SOLN
4.0000 mg | Freq: Once | INTRAMUSCULAR | Status: AC
  Administered 2023-06-01: 4 mg via INTRAVENOUS
  Filled 2023-06-01: qty 2

## 2023-06-01 MED ORDER — APIXABAN 5 MG PO TABS: 5.0000 mg | ORAL_TABLET | Freq: Two times a day (BID) | ORAL | 0 refills | Status: DC

## 2023-06-01 MED ORDER — PROCAINAMIDE HCL 100 MG/ML IJ SOLN: 100.0000 mg | INTRAVENOUS | Status: DC | PRN

## 2023-06-01 MED ORDER — ETOMIDATE 2 MG/ML IV SOLN
INTRAVENOUS | Status: AC
  Administered 2023-06-01: 8 mg via INTRAVENOUS
  Filled 2023-06-01: qty 10

## 2023-06-01 MED ORDER — ETOMIDATE 2 MG/ML IV SOLN: 8.0000 mg | Freq: Once | INTRAVENOUS | Status: AC

## 2023-06-01 NOTE — ED Provider Notes (Signed)
[place] EMERGENCY DEPARTMENT AT [facility] Provider Note   CSN: [id number] Arrival date & time: 06/01/23  1018     History {Add pertinent medical, surgical, social history, OB history to HPI:1} Chief Complaint  Patient presents with   Chest Pain    Renee Klein is a 67 y.o. female with PMH as listed below who presents with mid sternal chest pain with radiation to left neck and up shoulders and around the back; pt c/o headache as well. Started at 0100 AM. No h/o similar.  No history of any known electrophysiological abnormalities.  She has been told she has "a heart murmur."  She currently denies any chest pain states it has subsided but still feels very fatigued.  She denies any shortness of breath, nausea vomiting, abdominal pain, leg swelling, fevers chills, recent illnesses.  Up until 1 AM was in her normal state of health.  Has been eating and drinking normally.  No recent changes to her medications.  Does not take any blood thinner or beta-blocker/calcium channel blocker.  No history of any arrhythmia.  On arrival patient with regular tachycardic rate in the 150s.  EKG demonstrates signs of preexcitation.  No P waves visible.  Consider SVT or a flutter 2-1 with possible preexcitation.  Past Medical History:  Diagnosis Date   Arthritis    Asthma    Concussion    Depression    Endometriosis    GERD (gastroesophageal reflux disease)    Heart murmur    Hypercholesterolemia        Home Medications Prior to Admission medications   Medication Sig Start Date End Date Taking? Authorizing Provider  albuterol (PROVENTIL HFA;VENTOLIN HFA) 108 (90 BASE) MCG/ACT inhaler Inhale 2 puffs into the lungs every 6 (six) hours as needed. For shortness of breath    [provider]  ARNUITY ELLIPTA 200 MCG/ACT AEPB Inhale 2 puffs into the lungs daily.  11/20/16   [provider]  fluconazole (DIFLUCAN) 200 MG tablet Take 200 mg by mouth once a week. 06/11/20   [provider]  fluticasone (FLONASE) 50 MCG/ACT nasal spray Place 1 spray into both nostrils daily as needed for allergies.     [provider]  montelukast (SINGULAIR) 10 MG tablet Take 10 mg by mouth daily.     [provider]  Multiple Vitamin (MULTIVITAMIN) capsule Take 1 capsule by mouth daily.    [provider]  Multiple Vitamins-Minerals (WOMENS MULTIVITAMIN PO) Take 1 tablet by mouth daily.     [provider]  naproxen sodium (ALEVE) 220 MG tablet Take by mouth. 09/07/17   [provider]  pantoprazole (PROTONIX) 40 MG tablet Take 40 mg by mouth every morning.     [provider]  PARoxetine (PAXIL) 20 MG tablet Take 20 mg by mouth every morning.     [provider]  Propylene Glycol (SYSTANE BALANCE) 0.6 % SOLN Place 1-2 drops into both eyes 3 (three) times daily as needed (dry eyes).    [provider]      Allergies    Codeine, Levofloxacin, Lipitor [atorvastatin calcium], and Statins    Review of Systems   Review of Systems A 10 point review of systems was performed and is negative unless otherwise reported in HPI.  Physical Exam Updated Vital Signs BP 106/74   Pulse 66   Temp 98.4 F (36.9 C) (Oral)   Resp 14   Ht 5\' 5"  (1.651 m)   Wt 120.2 kg  SpO2 98%   BMI 44.10 kg/m  Physical Exam General: Normal appearing female, lying in bed.  HEENT: PERRLA, Sclera anicteric, MMM, trachea midline.  Cardiology: RRR, no murmurs/rubs/gallops. BL radial and DP pulses equal bilaterally.  Resp: Normal respiratory rate and effort. CTAB, no wheezes, rhonchi, crackles.  Abd: Soft, non-tender, non-distended. No rebound tenderness or guarding.  GU: Deferred. MSK: No peripheral edema or signs of trauma. Extremities without deformity or TTP. No cyanosis or clubbing. Skin: warm, dry. No rashes or lesions. Back: No CVA tenderness Neuro: A&Ox4, CNs II-XII grossly intact. MAEs. Sensation grossly intact.  Psych:  Normal mood and affect.   ED Results / Procedures / Treatments   Labs (all labs ordered are listed, but only abnormal results are displayed) Labs Reviewed  COMPREHENSIVE METABOLIC PANEL - Abnormal; Notable for the following components:      Result Value   Glucose, Bld 111 (*)    Calcium 8.8 (*)    Albumin 3.4 (*)    All other components within normal limits  CBC WITH DIFFERENTIAL/PLATELET  MAGNESIUM  RAPID URINE DRUG SCREEN, HOSP PERFORMED  URINALYSIS, ROUTINE W REFLEX MICROSCOPIC    EKG EKG Interpretation Date/Time:  Monday June 01 2023 10:36:41 EDT Ventricular Rate:  155 PR Interval:  50 QRS Duration:  107 QT Interval:  232 QTC Calculation: 373 R Axis:   67  Text Interpretation: Narrow complex tachycardia with v entricular preexcitation(WPW) Consider A flutter 2:1 vs SVT with delta wave visible No prior for comparison Confirmed by Vivi Barrack [phone]) on 06/01/2023 12:14:09 PM  Radiology No results found.  Procedures .Cardioversion  Date/Time: 06/01/2023 12:10 PM  Performed by: Loetta Rough, MD Authorized by: Loetta Rough, MD   Consent:    Consent obtained:  Verbal   Consent given by:  Patient and spouse   Risks discussed:  Cutaneous burn, death, induced arrhythmia and pain   Alternatives discussed:  No treatment and alternative treatment Pre-procedure details:    Cardioversion basis: urgent.   Rhythm:  Atrial flutter   Electrode placement:  Anterior-posterior Patient sedated: Yes. Refer to sedation procedure documentation for details of sedation.  Attempt one:    Cardioversion mode:  Synchronous   Waveform:  Monophasic   Shock (Joules):  200   Shock outcome:  Conversion to normal sinus rhythm Post-procedure details:    Patient status:  Awake   Patient tolerance of procedure:  Tolerated well, no immediate complications .Sedation  Date/Time: 06/01/2023 12:11 PM  Performed by: Loetta Rough, MD Authorized by: Loetta Rough, MD   Consent:     Consent obtained:  Verbal   Consent given by:  Spouse and patient   Risks discussed:  Allergic reaction, dysrhythmia, inadequate sedation, nausea, vomiting, respiratory compromise necessitating ventilatory assistance and intubation, prolonged sedation necessitating reversal and prolonged hypoxia resulting in organ damage   Alternatives discussed:  Analgesia without sedation Universal protocol:    Immediately prior to procedure, a time out was called: yes     Patient identity confirmed:  Verbally with patient Indications:    Procedure performed:  Cardioversion Pre-sedation assessment:    Time since last food or drink:  >12 hours   ASA classification: class 2 - patient with mild systemic disease     Mouth opening:  2 finger widths   Thyromental distance:  2 finger widths   Mallampati score:  III - soft palate, base of uvula visible   Neck mobility: normal     Pre-sedation assessments completed and reviewed: pre-procedure  airway patency not reviewed, pre-procedure hydration status not reviewed, pre-procedure mental status not reviewed, pre-procedure nausea and vomiting status not reviewed, pre-procedure pain level not reviewed, pre-procedure respiratory function not reviewed and pre-procedure temperature not reviewed   Immediate pre-procedure details:    Reassessment: Patient reassessed immediately prior to procedure     Reviewed: vital signs, relevant labs/tests and NPO status     Verified: bag valve mask available, emergency equipment available, intubation equipment available, IV patency confirmed, oxygen available and suction available   Procedure details (see MAR for exact dosages):    Preoxygenation:  Room air   Sedation:  Etomidate   Intended level of sedation: moderate (conscious sedation)   Intra-procedure monitoring:  Continuous capnometry, continuous pulse oximetry, cardiac monitor, blood pressure monitoring, frequent LOC assessments and frequent vital sign checks    Intra-procedure events: none     Total Provider sedation time (minutes):  11 Post-procedure details:    Post-sedation assessments completed and reviewed: post-procedure airway patency not reviewed, post-procedure cardiovascular function not reviewed, post-procedure hydration status not reviewed, post-procedure mental status not reviewed, post-procedure nausea and vomiting status not reviewed, pain score not reviewed, post-procedure respiratory function not reviewed and post-procedure temperature not reviewed     Procedure completion:  Tolerated well, no immediate complications   {Document cardiac monitor, telemetry assessment procedure when appropriate:1}  Medications Ordered in ED Medications  ondansetron (ZOFRAN) injection 4 mg (4 mg Intravenous Given 06/01/23 1155)  etomidate (AMIDATE) injection 8 mg (8 mg Intravenous Given 06/01/23 1202)    ED Course/ Medical Decision Making/ A&P                          Medical Decision Making Amount and/or Complexity of Data Reviewed Labs: ordered. Radiology: ordered. ECG/medicine tests: ordered.  Risk Prescription drug management.    This patient presents to the ED for concern of chest pain, fatigue, tachycardia, this involves an extensive number of treatment options, and is a complaint that carries with it a high risk of complications and morbidity.  I considered the following differential and admission for this acute, potentially life threatening condition.   MDM:    Patient presents with regular narrow complex tachycardia with rate in the 150s, highest again concern for a flutter 2-1 versus SVT.  Patient has no history preexcitation however EKG demonstrates concern for delta wave.  No prior EKGs for comparison and patient has no history of any WPW that she is aware of.  Patient does not have any current chest pain, blood pressure in the 140s, she just feels fatigued, I do believe patient is stable at this time.  I did shared decision making  with the patient for sedated synchronized cardioversion versus procainamide and patient would like to try procainamide first.  If patient becomes unstable we will move to synchronized cardioversion.  She is placed on the pads with monitor at bedside.  Consider electrolyte derangements, anemia, preexcitation syndrome, as possible etiologies of her arrhythmia.  Patient did have chest pain but this is likely due to the arrhythmia, lower concern for ACS, chest pain is not exertional and no associated diaphoresis or vomiting to suggest ACS.  She has no leg swelling and had no chest pain or shortness of breath prior to today, lower concern for PE.  No signs or symptoms of DVT on exam, no risk factors for PE.  Clinical Course as of 06/01/23 1214  Mon Jun 01, 2023  1150 Pharmacy called me to discuss.  Procainamide is not stocked today in the [facility].  Will take at least 1 hour to obtain it via stat courier from [facility].  Patient is still currently stable but seeing as the procainamide will still take 30 to 60 minutes for infusion to take effect, will be upwards of 2 additional hours before patient will presumably have control of her arrhythmia, I discussed with the patient and recommended sedated synchronized cardioversion.  Patient is agreeable to this and will be sedated with etomidate with respiratory therapy present. [HN]  1209 Pt sedated with etomidate (pretreated with zofran< IVF hung) and shocked synchronized cardioversion 200 J with return to NSR.  [HN]  1213 UA clear, UDS neg. CBC wnl. Mg wnl, no electrolyte derangements on CMP.  [HN]    Clinical Course User Index [HN] Loetta Rough, MD    Labs: I Ordered, and personally interpreted labs.  The pertinent results include:  those listed above  Imaging Studies ordered: I ordered imaging studies including chest x-ray I independently visualized and interpreted imaging. I agree with the radiologist interpretation  Additional  history obtained from chart review, husband at bedside.    Cardiac Monitoring: The patient was maintained on a cardiac monitor.  I personally viewed and interpreted the cardiac monitored which showed an underlying rhythm of: Regular narrow complex tachycardia  Reevaluation: After the interventions noted above, I reevaluated the patient and found that they have :{resolved/improved/worsened:23923::"improved"}  Social Determinants of Health:  lives independently  Disposition:  ***  Co morbidities that complicate the patient evaluation  Past Medical History:  Diagnosis Date   Arthritis    Asthma    Concussion    Depression    Endometriosis    GERD (gastroesophageal reflux disease)    Heart murmur    Hypercholesterolemia      Medicines Meds ordered this encounter  Medications   DISCONTD: procainamide (PRONESTYL) 100 mg in dextrose 5 % 250 mL IVPB   etomidate (AMIDATE) 2 MG/ML injection    Charlies Silvers, Bobbi J: cabinet override   ondansetron (ZOFRAN) injection 4 mg   etomidate (AMIDATE) injection 8 mg    I have reviewed the patients home medicines and have made adjustments as needed  Problem List / ED Course: Problem List Items Addressed This Visit   None        {Document critical care time when appropriate:1} {Document review of labs and clinical decision tools ie heart score, Chads2Vasc2 etc:1}  {Document your independent review of radiology images, and any outside records:1} {Document your discussion with family members, caretakers, and with consultants:1} {Document social determinants of health affecting pt's care:1} {Document your decision making why or why not admission, treatments were needed:1}  This note was created using dictation software, which may contain spelling or grammatical errors.

## 2023-06-01 NOTE — ED Notes (Addendum)
Cardioverson  Pt placed on ETCO2 NS bolus started 1202 etomide 8mg  given 1204 1 sycn shock at 200j by MD, successful   Vitals before and after  Pt responding normally Call bell on stretcher Husband at bedside

## 2023-06-01 NOTE — Addendum Note (Signed)
Addended by: Leonides Schanz C on: 06/01/2023 12:30 PM   Modules accepted: Orders

## 2023-06-01 NOTE — ED Notes (Signed)
Introduced self to pt Pt that she woke up at 1am with pain 10/10 under LEFT armpit and breast that radiated to shoulder and neck Pt stated that it felt like her ears were going to explode Pt laid on bathroom floor, stated that the cold felt good Around 3am ear pressure relieved  Currently has pain under LEFT arm pit that comes and goes 4/10 now. Denies SOB 2 IV's in place On cardiac monitor and attached to pads and crash cart Call bell on stretcher, family at bedside  Waiting for meds to control HR

## 2023-06-01 NOTE — Discharge Instructions (Signed)
Thank you for coming to [facility] Emergency Department. You were seen for chest pain, tachycardia. We did an exam, labs, and imaging, and these showed an arrhythmia (funny heart rhythm) called atrial flutter. We performed a cardioversion and you are in normal rhythm. We discussed with cardiology who recommended a heart monitor which will be mailed to you, blood thinner for 4 weeks, and a rate control medication.  Please take metoprolol 12.5 mg twice per day and eliquis 5 mg twice per day. This is a blood thinning medication.  We have made a referral for you to cardiology.  They will call you sometime within the next few days to make an appointment.  If you are able to, please make an appointment within the next 1 to 2 weeks.  Do not hesitate to return to the ED or call 911 if you experience: -Worsening symptoms -Chest pain, shortness of breath -Lightheadedness, passing out -Fevers/chills -Anything else that concerns you

## 2023-06-01 NOTE — ED Notes (Signed)
Called pharmacy for meds Spoke to Viviann Spare, he will get meds to ED

## 2023-06-01 NOTE — ED Notes (Signed)
Flushed IV in RIGHT AC, blown Removed

## 2023-06-01 NOTE — ED Triage Notes (Signed)
Pt c/o mid sternal chest pain with radiation to left neck and up shoulders and around the back; pt c/o headache and ear pain

## 2023-06-02 ENCOUNTER — Encounter: Payer: Self-pay | Admitting: Cardiology

## 2023-06-02 ENCOUNTER — Ambulatory Visit: Payer: 59 | Attending: Cardiology | Admitting: Cardiology

## 2023-06-02 VITALS — BP 122/84 | HR 65 | Ht 65.0 in | Wt 277.6 lb

## 2023-06-02 DIAGNOSIS — I4892 Unspecified atrial flutter: Secondary | ICD-10-CM | POA: Diagnosis not present

## 2023-06-02 MED ORDER — APIXABAN 5 MG PO TABS: 5.0000 mg | ORAL_TABLET | Freq: Two times a day (BID) | ORAL | 1 refills | Status: DC

## 2023-06-02 MED ORDER — METOPROLOL TARTRATE 25 MG PO TABS: 12.5000 mg | ORAL_TABLET | Freq: Two times a day (BID) | ORAL | 1 refills | Status: DC

## 2023-06-02 NOTE — Progress Notes (Signed)
Clinical Summary Renee Klein is a 67 y.o.female seen as a new patient today for the following medical problems.  1.Chest pain - ER visit yesterday with chest pain and headache - in ER tachycardic rates to 150s  - in ER notes there was some concern of preexcitation based on EKGs. AV nodal agents were avoided, procainamide was not readily available and thus proceeded with electrical cardioversion - post conversion EKG showed NSR, normal PR wave without preexcitation - discharged with home monitor - started on eliquis 5mg  bid, lopressor 25mg  bid  - woke 1AM felt like head was going to explode, pain left axillary into back. Pressure like pain, worst with position.  - symptoms have resolved s/p DCCV   2. OSA - compliant with cpap, followed in [place] by pulmonary     Past Medical History:  Diagnosis Date   Arthritis    Asthma    Concussion    Depression    Endometriosis    GERD (gastroesophageal reflux disease)    Heart murmur    Hypercholesterolemia      Allergies  Allergen Reactions   Codeine    Levofloxacin    Lipitor [Atorvastatin Calcium] Other (See Comments)    Muscle weakness   Statins Other (See Comments)    Muscle weakness     Current Outpatient Medications  Medication Sig Dispense Refill   albuterol (PROVENTIL HFA;VENTOLIN HFA) 108 (90 BASE) MCG/ACT inhaler Inhale 2 puffs into the lungs every 6 (six) hours as needed. For shortness of breath     apixaban (ELIQUIS) 5 MG TABS tablet Take 1 tablet (5 mg total) by mouth 2 (two) times daily. 60 tablet 0   ARNUITY ELLIPTA 200 MCG/ACT AEPB Inhale 2 puffs into the lungs daily.      fluconazole (DIFLUCAN) 200 MG tablet Take 200 mg by mouth once a week.     fluticasone (FLONASE) 50 MCG/ACT nasal spray Place 1 spray into both nostrils daily as needed for allergies.      metoprolol tartrate (LOPRESSOR) 25 MG tablet Take 0.5 tablets (12.5 mg total) by mouth 2 (two) times daily. 15 tablet 2   montelukast  (SINGULAIR) 10 MG tablet Take 10 mg by mouth daily.      Multiple Vitamin (MULTIVITAMIN) capsule Take 1 capsule by mouth daily.     Multiple Vitamins-Minerals (WOMENS MULTIVITAMIN PO) Take 1 tablet by mouth daily.      naproxen sodium (ALEVE) 220 MG tablet Take by mouth.     pantoprazole (PROTONIX) 40 MG tablet Take 40 mg by mouth every morning.      PARoxetine (PAXIL) 20 MG tablet Take 20 mg by mouth every morning.      Propylene Glycol (SYSTANE BALANCE) 0.6 % SOLN Place 1-2 drops into both eyes 3 (three) times daily as needed (dry eyes).     No current facility-administered medications for this visit.     Past Surgical History:  Procedure Laterality Date   ABDOMINAL HYSTERECTOMY     bone scraping left thumb     BUNIONECTOMY Bilateral    CHOLECYSTECTOMY N/A 06/01/2017   Procedure: LAPAROSCOPIC CHOLECYSTECTOMY;  Surgeon: Franky Macho, MD;  Location: [facility];  Service: General;  Laterality: N/A;   DIAGNOSTIC LAPAROSCOPY     endometriosis   KNEE ARTHROPLASTY Left    THUMB AMPUTATION Left    Scrape bone secondary to infection   TONSILLECTOMY       Allergies  Allergen Reactions   Codeine    Levofloxacin  Lipitor [Atorvastatin Calcium] Other (See Comments)    Muscle weakness   Statins Other (See Comments)    Muscle weakness      Family History  Problem Relation Age of Onset   Heart attack Father    Other Father        heart valve surgery   Heart attack Mother    Seizures Sister    Breast cancer Sister    Hypertension Sister      Social History Ms. Gilbert reports that she has never smoked. She has never used smokeless tobacco. Ms. Kawa reports current alcohol use.   Review of Systems CONSTITUTIONAL: No weight loss, fever, chills, weakness or fatigue.  HEENT: Eyes: No visual loss, blurred vision, double vision or yellow sclerae.No hearing loss, sneezing, congestion, runny nose or sore throat.  SKIN: No rash or itching.  CARDIOVASCULAR: per hpi RESPIRATORY: No  shortness of breath, cough or sputum.  GASTROINTESTINAL: No anorexia, nausea, vomiting or diarrhea. No abdominal pain or blood.  GENITOURINARY: No burning on urination, no polyuria NEUROLOGICAL: No headache, dizziness, syncope, paralysis, ataxia, numbness or tingling in the extremities. No change in bowel or bladder control.  MUSCULOSKELETAL: No muscle, back pain, joint pain or stiffness.  LYMPHATICS: No enlarged nodes. No history of splenectomy.  PSYCHIATRIC: No history of depression or anxiety.  ENDOCRINOLOGIC: No reports of sweating, cold or heat intolerance. No polyuria or polydipsia.  Marland Kitchen   Physical Examination Today's Vitals   06/02/23 1317  BP: 122/84  Pulse: 65  SpO2: 96%  Weight: 277 lb 9.6 oz (125.9 kg)  Height: 5\' 5"  (1.651 m)   Body mass index is 46.2 kg/m.  Gen: resting comfortably, no acute distress HEENT: no scleral icterus, pupils equal round and reactive, no palptable cervical adenopathy,  CV: RRR, no mrg, no jvd Resp: Clear to auscultation bilaterally GI: abdomen is soft, non-tender, non-distended, normal bowel sounds, no hepatosplenomegaly MSK: extremities are warm, no edema.  Skin: warm, no rash Neuro:  no focal deficits Psych: appropriate affect   Assessment and Plan  1.Aflutter - new diagnosis with ER visit yesterday - symptoms <48 hrs, she was cardioverted in the ER. There was some question by the ER about preexcitiation but I do not see convincing evidence, her post conversion EKG has normal PR interval - CHADS2Vasc score is at least 23 (age, gender, HTN), continue eliquis  - refer to EP to consider possible ablation for typical aflutter. Patient was very symptomatic in the rhythm, has some interested in ablation.  - order echo        Antoine Poche, M.D.

## 2023-06-02 NOTE — Patient Instructions (Signed)
Medication Instructions:   Eliquis & Metoprolol refilled today  Continue all other medications.     Labwork:  none  Testing/Procedures:  Your physician has requested that you have an echocardiogram. Echocardiography is a painless test that uses sound waves to create images of your heart. It provides your doctor with information about the size and shape of your heart and how well your heart's chambers and valves are working. This procedure takes approximately one hour. There are no restrictions for this procedure. Please do NOT wear cologne, perfume, aftershave, or lotions (deodorant is allowed). Please arrive 15 minutes prior to your appointment time. Office will contact with results via phone, letter or mychart.     Follow-Up:  6 months   Any Other Special Instructions Will Be Listed Below (If Applicable).  You have been referred to Electrophysiology   If you need a refill on your cardiac medications before your next appointment, please call your pharmacy.

## 2023-06-04 DIAGNOSIS — I4892 Unspecified atrial flutter: Secondary | ICD-10-CM

## 2023-06-16 ENCOUNTER — Ambulatory Visit: Payer: 59 | Attending: Cardiology

## 2023-06-16 DIAGNOSIS — I4892 Unspecified atrial flutter: Secondary | ICD-10-CM

## 2023-06-17 LAB — ECHOCARDIOGRAM COMPLETE
AR max vel: 2.23 cm2
AV Area VTI: 2.64 cm2
AV Area mean vel: 2.53 cm2
AV Mean grad: 6 mm[Hg]
AV Peak grad: 14 mm[Hg]
Ao pk vel: 1.87 m/s
Area-P 1/2: 2.92 cm2
Calc EF: 73.2 %
MV VTI: 1.54 cm2
S' Lateral: 2.4 cm
Single Plane A2C EF: 72.5 %
Single Plane A4C EF: 73.6 %

## 2023-07-09 ENCOUNTER — Encounter: Payer: Self-pay | Admitting: Cardiovascular Disease

## 2023-07-09 ENCOUNTER — Ambulatory Visit: Payer: 59 | Attending: Cardiovascular Disease | Admitting: Cardiovascular Disease

## 2023-07-09 VITALS — BP 120/86 | HR 70 | Ht 65.0 in | Wt 273.6 lb

## 2023-07-09 DIAGNOSIS — I4892 Unspecified atrial flutter: Secondary | ICD-10-CM | POA: Diagnosis not present

## 2023-07-09 NOTE — Progress Notes (Signed)
Electrophysiology Office Note:    Date:  07/09/2023   ID:  Renee Klein, DOB [birth year], MRN [id number]  PCP:  Exie Parody, MD   [facility] Providers Cardiologist:  None     Referring MD: Antoine Poche, MD   History of Present Illness:    Renee Klein is a 67 y.o. female with a medical history significant for OSA and obesity, referred for management of atrial flutter.     Discussed the use of AI scribe software for clinical note transcription with the patient, who gave verbal consent to proceed.  The patient is referred to EP for evaluation of atrial flutter after presenting to the ER with substernal chest pain radiating to the left neck in the setting of atrial flutter with 2:1 conduction. She underwent DC cardioversion. The patient denies any unusual activities or dehydration prior to the episode but acknowledges a stressful week at work and the passing of her sister. The patient also reports occasional episodes of heart palpitations,.  She has cut out drinking sweet tea in order to reduce calories and decrease caffeine intake.     Today, she reports that she is doing well.  She continues to have occasional palpitations but no sustained rapid rates, chest discomfort.  EKGs/Labs/Other Studies Reviewed Today:     Echocardiogram:  TTE June 17, 2023 EF 70 to 75%.  No wall motion abnormalities.   Monitors:  14-day ZIO September 2024 Sinus rhythm heart rate 42 to 106 bpm, average 63 Rare ectopy, less than 1% There were occasional episodes of SVT, longest 11.5 seconds, most consistent with atrial runs There were patient triggered episodes that appeared to be sinus rhythm, occasional isolated PACs    EKG:   EKG Interpretation Date/Time:  Thursday July 09 2023 14:59:37 EDT Ventricular Rate:  70 PR Interval:  122 QRS Duration:  82 QT Interval:  414 QTC Calculation: 447 R Axis:   81  Text Interpretation: Normal sinus rhythm Low voltage QRS When  compared with ECG of 02-Jun-2023 13:23, No significant change was found Confirmed by York Pellant [phone]) on 07/09/2023 3:18:55 PM     Physical Exam:    VS:  BP 120/86 (BP Location: Left Arm, Patient Position: Sitting, Cuff Size: Large)   Pulse 70   Ht 5\' 5"  (1.651 m)   Wt 273 lb 9.6 oz (124.1 kg)   SpO2 96%   BMI 45.53 kg/m     Wt Readings from Last 3 Encounters:  07/09/23 273 lb 9.6 oz (124.1 kg)  06/02/23 277 lb 9.6 oz (125.9 kg)  06/01/23 265 lb (120.2 kg)     GEN: Well nourished, well developed in no acute distress CARDIAC: RRR, no murmurs, rubs, gallops RESPIRATORY:  Normal work of breathing MUSCULOSKELETAL: no edema    ASSESSMENT & PLAN:     Typical atrial flutter Required DC cardioversion in ER Monitor showed patient no episodes of atrial runs, which could trigger future flutter events I recommended flutter ablation; she would like to proceed  We discussed the indication, rationale, logistics, anticipated benefits, and potential risks of the ablation procedure including but not limited to -- bleed at the groin access site, chest pain, damage to nearby organs such as the diaphragm, lungs, or esophagus, need for a drainage tube, or prolonged hospitalization. I explained that the risk for stroke, heart attack, need for open chest surgery, or even death is very low but not zero. she  expressed understanding and wishes to proceed.   Morbid obesity We  discussed the importance of weight loss for procedural safety and prevention of future arrhythmias  Obstructive sleep apnea Compliant with CPAP     Signed, Maurice Small, MD  07/09/2023 3:25 PM    [facility]

## 2023-07-09 NOTE — Patient Instructions (Signed)
Medication Instructions:  Your physician recommends that you continue on your current medications as directed. Please refer to the Current Medication list given to you today. *If you need a refill on your cardiac medications before your next appointment, please call your pharmacy*   Lab Work: CBC and BMET on 09/14/23 If you have labs (blood work) drawn today and your tests are completely normal, you will receive your results only by: MyChart Message (if you have MyChart) OR A paper copy in the mail If you have any lab test that is abnormal or we need to change your treatment, we will call you to review the results.   Testing/Procedures: Atrial Flutter Ablation  Your physician has recommended that you have an ablation. Catheter ablation is a medical procedure used to treat some cardiac arrhythmias (irregular heartbeats). During catheter ablation, a long, thin, flexible tube is put into a blood vessel in your groin (upper thigh), or neck. This tube is called an ablation catheter. It is then guided to your heart through the blood vessel. Radio frequency waves destroy small areas of heart tissue where abnormal heartbeats may cause an arrhythmia to start. Please see the instruction sheet given to you today.  You are scheduled for  Atrial Flutter Ablation  on Monday, January 20 with Dr. Halford Chessman.Please arrive at the Main Entrance A at [facility]: [address] [place], [place] [address] at 11:30 AM     Follow-Up: At [facility], you and your health needs are our priority.  As part of our continuing mission to provide you with exceptional heart care, we have created designated Provider Care Teams.  These Care Teams include your primary Cardiologist (physician) and Advanced Practice Providers (APPs -  Physician Assistants and Nurse Practitioners) who all work together to provide you with the care you need, when you need it.  We recommend signing up for the patient portal called  "MyChart".  Sign up information is provided on this After Visit Summary.  MyChart is used to connect with patients for Virtual Visits (Telemedicine).  Patients are able to view lab/test results, encounter notes, upcoming appointments, etc.  Non-urgent messages can be sent to your provider as well.   To learn more about what you can do with MyChart, go to [email or web address].    Your next appointment:   We will schedule follow up after your ablation   Provider:   York Pellant, MD

## 2023-07-17 ENCOUNTER — Telehealth: Payer: Self-pay | Admitting: Cardiovascular Disease

## 2023-07-17 NOTE — Telephone Encounter (Signed)
Patient stated she will be getting new insurance in January 2025 and wants to be put on the cancellation list to have her ablation sooner.  Patient wants call back to confirm.

## 2023-07-17 NOTE — Telephone Encounter (Signed)
Spoke with patient, already on wait list and ablation currently scheduled for 10/05/23. Will call if earlier spot becomes available.

## 2023-08-19 ENCOUNTER — Telehealth: Payer: Self-pay

## 2023-08-19 NOTE — Telephone Encounter (Signed)
Spoke with patient, offered ablation opening on 08/24/23 but patient currently sick and on medication from PCP.

## 2023-09-04 ENCOUNTER — Telehealth: Payer: Self-pay

## 2023-09-04 NOTE — Telephone Encounter (Signed)
Spoke with patient about moving ablation up to 09/18/23 but patient would like to proceed with 10/05/23 date as already scheduled. No needs at this time

## 2023-09-14 ENCOUNTER — Other Ambulatory Visit: Payer: 59

## 2023-09-14 DIAGNOSIS — I4892 Unspecified atrial flutter: Secondary | ICD-10-CM

## 2023-09-14 LAB — CBC

## 2023-09-15 LAB — BASIC METABOLIC PANEL
BUN/Creatinine Ratio: 11 — ABNORMAL LOW (ref 12–28)
BUN: 11 mg/dL (ref 8–27)
CO2: 21 mmol/L (ref 20–29)
Calcium: 8.8 mg/dL (ref 8.7–10.3)
Chloride: 106 mmol/L (ref 96–106)
Creatinine, Ser: 0.97 mg/dL (ref 0.57–1.00)
Glucose: 111 mg/dL — ABNORMAL HIGH (ref 70–99)
Potassium: 4.1 mmol/L (ref 3.5–5.2)
Sodium: 141 mmol/L (ref 134–144)
eGFR: 65 mL/min/{1.73_m2} (ref >59)

## 2023-09-15 LAB — CBC
Hematocrit: 42 % (ref 34.0–46.6)
Hemoglobin: 13.7 g/dL (ref 11.1–15.9)
MCH: 31.2 pg (ref 26.6–33.0)
MCHC: 32.6 g/dL (ref 31.5–35.7)
MCV: 96 fL (ref 79–97)
Platelets: 180 10*3/uL (ref 150–450)
RBC: 4.39 x10E6/uL (ref 3.77–5.28)
RDW: 12.1 % (ref 11.7–15.4)
WBC: 4.6 10*3/uL (ref 3.4–10.8)

## 2023-10-02 NOTE — Pre-Procedure Instructions (Addendum)
Spoke with patient's spouse Renee Klein. Instructed on the following items: Arrival time 1000 Nothing to eat or drink after midnight No meds AM of procedure Responsible person to drive you home and stay with you for 24 hrs  Have you missed any doses of anti-coagulant Elqiuis- takes twice a day, hasn't missed any doses.   Don't take dose on Monday morning.

## 2023-10-05 ENCOUNTER — Ambulatory Visit (HOSPITAL_COMMUNITY): Payer: Self-pay | Admitting: Anesthesiology

## 2023-10-05 ENCOUNTER — Ambulatory Visit (HOSPITAL_COMMUNITY)
Admission: RE | Disposition: A | Discharge status: Home / Self Care | Payer: Self-pay | Source: Home / Self Care | Attending: Cardiovascular Disease

## 2023-10-05 ENCOUNTER — Ambulatory Visit (HOSPITAL_COMMUNITY)
Admission: RE | Admit: 2023-10-05 | Discharge: 2023-10-05 | Disposition: A | Discharge status: Home / Self Care | Payer: Managed Care, Other (non HMO) | Attending: Cardiovascular Disease | Admitting: Cardiovascular Disease

## 2023-10-05 ENCOUNTER — Other Ambulatory Visit: Payer: Self-pay

## 2023-10-05 ENCOUNTER — Encounter (HOSPITAL_COMMUNITY): Payer: Self-pay | Admitting: Cardiovascular Disease

## 2023-10-05 ENCOUNTER — Ambulatory Visit (HOSPITAL_BASED_OUTPATIENT_CLINIC_OR_DEPARTMENT_OTHER): Payer: Self-pay | Admitting: Anesthesiology

## 2023-10-05 DIAGNOSIS — K219 Gastro-esophageal reflux disease without esophagitis: Secondary | ICD-10-CM | POA: Diagnosis not present

## 2023-10-05 DIAGNOSIS — I4892 Unspecified atrial flutter: Secondary | ICD-10-CM

## 2023-10-05 DIAGNOSIS — I483 Typical atrial flutter: Secondary | ICD-10-CM | POA: Insufficient documentation

## 2023-10-05 DIAGNOSIS — I10 Essential (primary) hypertension: Secondary | ICD-10-CM | POA: Diagnosis not present

## 2023-10-05 DIAGNOSIS — G4733 Obstructive sleep apnea (adult) (pediatric): Secondary | ICD-10-CM | POA: Insufficient documentation

## 2023-10-05 DIAGNOSIS — F32A Depression, unspecified: Secondary | ICD-10-CM

## 2023-10-05 DIAGNOSIS — Z6841 Body Mass Index (BMI) 40.0 and over, adult: Secondary | ICD-10-CM | POA: Diagnosis not present

## 2023-10-05 DIAGNOSIS — J45909 Unspecified asthma, uncomplicated: Secondary | ICD-10-CM

## 2023-10-05 DIAGNOSIS — I471 Supraventricular tachycardia, unspecified: Secondary | ICD-10-CM | POA: Diagnosis not present

## 2023-10-05 HISTORY — PX: A-FLUTTER ABLATION: EP1230

## 2023-10-05 SURGERY — A-FLUTTER ABLATION
Anesthesia: General

## 2023-10-05 MED ORDER — PHENYLEPHRINE HCL-NACL 20-0.9 MG/250ML-% IV SOLN
INTRAVENOUS | Status: DC | PRN
  Administered 2023-10-05: 30 ug/min via INTRAVENOUS

## 2023-10-05 MED ORDER — PHENYLEPHRINE 80 MCG/ML (10ML) SYRINGE FOR IV PUSH (FOR BLOOD PRESSURE SUPPORT)
PREFILLED_SYRINGE | INTRAVENOUS | Status: DC | PRN
  Administered 2023-10-05: 160 ug via INTRAVENOUS

## 2023-10-05 MED ORDER — HEPARIN SODIUM (PORCINE) 1000 UNIT/ML IJ SOLN
INTRAMUSCULAR | Status: DC | PRN
  Administered 2023-10-05: 1000 [IU] via INTRAVENOUS

## 2023-10-05 MED ORDER — SODIUM CHLORIDE 0.9 % IV SOLN: INTRAVENOUS | Status: DC

## 2023-10-05 MED ORDER — PROPOFOL 10 MG/ML IV BOLUS
INTRAVENOUS | Status: DC | PRN
  Administered 2023-10-05: 120 mg via INTRAVENOUS

## 2023-10-05 MED ORDER — ACETAMINOPHEN 325 MG PO TABS: 650.0000 mg | ORAL_TABLET | ORAL | Status: DC | PRN

## 2023-10-05 MED ORDER — COLCHICINE 0.6 MG PO TABS: 0.6000 mg | ORAL_TABLET | Freq: Two times a day (BID) | ORAL | 0 refills | Status: AC | PRN

## 2023-10-05 MED ORDER — FENTANYL CITRATE (PF) 100 MCG/2ML IJ SOLN
INTRAMUSCULAR | Status: AC
  Filled 2023-10-05: qty 2

## 2023-10-05 MED ORDER — SODIUM CHLORIDE 0.9 % IV SOLN: 250.0000 mL | INTRAVENOUS | Status: DC | PRN

## 2023-10-05 MED ORDER — ACETAMINOPHEN 500 MG PO TABS
1000.0000 mg | ORAL_TABLET | Freq: Once | ORAL | Status: AC
  Administered 2023-10-05: 1000 mg via ORAL
  Filled 2023-10-05: qty 2

## 2023-10-05 MED ORDER — ONDANSETRON HCL 4 MG/2ML IJ SOLN
INTRAMUSCULAR | Status: DC | PRN
  Administered 2023-10-05: 4 mg via INTRAVENOUS

## 2023-10-05 MED ORDER — ROCURONIUM BROMIDE 10 MG/ML (PF) SYRINGE
PREFILLED_SYRINGE | INTRAVENOUS | Status: DC | PRN
  Administered 2023-10-05: 50 mg via INTRAVENOUS
  Administered 2023-10-05: 30 mg via INTRAVENOUS

## 2023-10-05 MED ORDER — ONDANSETRON HCL 4 MG/2ML IJ SOLN: 4.0000 mg | Freq: Four times a day (QID) | INTRAMUSCULAR | Status: DC | PRN

## 2023-10-05 MED ORDER — LIDOCAINE 2% (20 MG/ML) 5 ML SYRINGE
INTRAMUSCULAR | Status: DC | PRN
  Administered 2023-10-05: 100 mg via INTRAVENOUS

## 2023-10-05 MED ORDER — ATROPINE SULFATE 1 MG/10ML IJ SOSY
PREFILLED_SYRINGE | INTRAMUSCULAR | Status: AC
  Filled 2023-10-05: qty 10

## 2023-10-05 MED ORDER — SUGAMMADEX SODIUM 200 MG/2ML IV SOLN
INTRAVENOUS | Status: DC | PRN
  Administered 2023-10-05: 400 mg via INTRAVENOUS

## 2023-10-05 MED ORDER — MIDAZOLAM HCL 2 MG/2ML IJ SOLN
INTRAMUSCULAR | Status: AC
  Filled 2023-10-05: qty 2

## 2023-10-05 MED ORDER — MIDAZOLAM HCL 2 MG/2ML IJ SOLN
INTRAMUSCULAR | Status: DC | PRN
  Administered 2023-10-05: 2 mg via INTRAVENOUS

## 2023-10-05 MED ORDER — DEXAMETHASONE SODIUM PHOSPHATE 10 MG/ML IJ SOLN
INTRAMUSCULAR | Status: DC | PRN
  Administered 2023-10-05: 10 mg via INTRAVENOUS

## 2023-10-05 MED ORDER — FENTANYL CITRATE (PF) 250 MCG/5ML IJ SOLN
INTRAMUSCULAR | Status: DC | PRN
  Administered 2023-10-05 (×2): 50 ug via INTRAVENOUS

## 2023-10-05 MED ORDER — SODIUM CHLORIDE 0.9% FLUSH: 3.0000 mL | INTRAVENOUS | Status: DC | PRN

## 2023-10-05 SURGICAL SUPPLY — 14 items
BAG SNAP BAND KOVER 36X36 (MISCELLANEOUS) IMPLANT
CATH ABLAT QDOT MICRO BI TC FJ (CATHETERS) IMPLANT
CATH EZ STEER NAV 8MM D-F CUR (ABLATOR) IMPLANT
CATH SOUNDSTAR ECO 8FR (CATHETERS) IMPLANT
CATH WEB BI DIR CSDF CRV REPRO (CATHETERS) IMPLANT
COVER SWIFTLINK CONNECTOR (BAG) IMPLANT
DEVICE CLOSURE MYNXGRIP 6/7F (Vascular Products) IMPLANT
PACK EP LF (CUSTOM PROCEDURE TRAY) ×1 IMPLANT
PAD DEFIB RADIO PHYSIO CONN (PAD) ×1 IMPLANT
PATCH CARTO3 (PAD) IMPLANT
SHEATH PINNACLE 8F 10CM (SHEATH) IMPLANT
SHEATH PINNACLE 9F 10CM (SHEATH) IMPLANT
SHEATH PROBE COVER 6X72 (BAG) IMPLANT
TUBING SMART ABLATE COOLFLOW (TUBING) IMPLANT

## 2023-10-05 NOTE — Anesthesia Preprocedure Evaluation (Addendum)
Anesthesia Evaluation  Patient identified by MRN, date of birth, ID band Patient awake    Reviewed: Allergy & Precautions, NPO status , Patient's Chart, lab work & pertinent test results  History of Anesthesia Complications Negative for: history of anesthetic complications  Airway Mallampati: II  TM Distance: >3 FB Neck ROM: Full    Dental  (+) Teeth Intact, Dental Advisory Given   Pulmonary asthma    Pulmonary exam normal breath sounds clear to auscultation       Cardiovascular hypertension, Pt. on medications and Pt. on home beta blockers Normal cardiovascular exam+ dysrhythmias Atrial Fibrillation  Rhythm:Regular Rate:Normal     Neuro/Psych  PSYCHIATRIC DISORDERS  Depression    negative neurological ROS     GI/Hepatic Neg liver ROS,GERD  Medicated,,  Endo/Other    Class 3 obesity  Renal/GU negative Renal ROS     Musculoskeletal  (+) Arthritis ,    Abdominal   Peds  Hematology  (+) Blood dyscrasia (Eliquis)   Anesthesia Other Findings   Reproductive/Obstetrics                             Anesthesia Physical Anesthesia Plan  ASA: 3  Anesthesia Plan: General   Post-op Pain Management: Tylenol PO (pre-op)*   Induction: Intravenous  PONV Risk Score and Plan: 3 and Midazolam, Dexamethasone and Ondansetron  Airway Management Planned: Oral ETT  Additional Equipment:   Intra-op Plan:   Post-operative Plan: Extubation in OR  Informed Consent: I have reviewed the patients History and Physical, chart, labs and discussed the procedure including the risks, benefits and alternatives for the proposed anesthesia with the patient or authorized representative who has indicated his/her understanding and acceptance.     Dental advisory given  Plan Discussed with: CRNA  Anesthesia Plan Comments:        Anesthesia Quick Evaluation

## 2023-10-05 NOTE — H&P (Signed)
Electrophysiology Office Note:    Date:  10/05/2023   ID:  Renee Klein, DOB [birth year], MRN [id number]  PCP:  Exie Parody, MD   [facility] Providers Cardiologist:  None Electrophysiologist:  Maurice Small, MD     Referring MD: No ref. provider found   History of Present Illness:    Renee Klein is a 68 y.o. female with a medical history significant for OSA and obesity, referred for management of atrial flutter.     Discussed the use of AI scribe software for clinical note transcription with the patient, who gave verbal consent to proceed.  The patient is referred to EP for evaluation of atrial flutter after presenting to the ER with substernal chest pain radiating to the left neck in the setting of atrial flutter with 2:1 conduction. She underwent DC cardioversion. The patient denies any unusual activities or dehydration prior to the episode but acknowledges a stressful week at work and the passing of her sister. The patient also reports occasional episodes of heart palpitations,.  She has cut out drinking sweet tea in order to reduce calories and decrease caffeine intake.     Today, she reports that she is doing well.  She continues to have occasional palpitations but no sustained rapid rates, chest discomfort.  She has not had any changes -- new diagnoses, medications, issues -- since our last visit. She has not missed any doses of eliquis; last dose was last night. She present today for atrial flutter ablation.  EKGs/Labs/Other Studies Reviewed Today:     Echocardiogram:  TTE June 17, 2023 EF 70 to 75%.  No wall motion abnormalities.   Monitors:  14-day ZIO September 2024 Sinus rhythm heart rate 42 to 106 bpm, average 63 Rare ectopy, less than 1% There were occasional episodes of SVT, longest 11.5 seconds, most consistent with atrial runs There were patient triggered episodes that appeared to be sinus rhythm, occasional isolated PACs    EKG:          Physical Exam:    VS:  BP (!) 147/81   Pulse 69   Temp 98 F (36.7 C) (Oral)   Resp 20   Ht 5\' 5"  (1.651 m)   Wt 120.2 kg   SpO2 96%   BMI 44.10 kg/m     Wt Readings from Last 3 Encounters:  10/05/23 120.2 kg  07/09/23 124.1 kg  06/02/23 125.9 kg     GEN: Well nourished, well developed in no acute distress CARDIAC: RRR, no murmurs, rubs, gallops RESPIRATORY:  Normal work of breathing MUSCULOSKELETAL: no edema    ASSESSMENT & PLAN:     Typical atrial flutter Required DC cardioversion in ER Monitor showed patient no episodes of atrial runs, which could trigger future flutter events I recommended flutter ablation; she would like to proceed  We discussed the indication, rationale, logistics, anticipated benefits, and potential risks of the ablation procedure including but not limited to -- bleed at the groin access site, chest pain, damage to nearby organs such as the diaphragm, lungs, or esophagus, need for a drainage tube, or prolonged hospitalization. I explained that the risk for stroke, heart attack, need for open chest surgery, or even death is very low but not zero. she  expressed understanding and wishes to proceed.   Morbid obesity We discussed the importance of weight loss for procedural safety and prevention of future arrhythmias  Obstructive sleep apnea Compliant with CPAP     Signed, Roberts Gaudy  Ferry Matthis, MD  10/05/2023 12:28 PM    [facility]

## 2023-10-05 NOTE — Transfer of Care (Signed)
Immediate Anesthesia Transfer of Care Note  Patient: Renee Klein  Procedure(s) Performed: A-FLUTTER ABLATION  Patient Location: Cath Lab  Anesthesia Type:General  Level of Consciousness: awake, alert , and oriented  Airway & Oxygen Therapy: Patient Spontanous Breathing and Patient connected to face mask oxygen  Post-op Assessment: Report given to RN and Post -op Vital signs reviewed and stable  Post vital signs: Reviewed and stable  Last Vitals:  Vitals Value Taken Time  BP 168/70   Temp    Pulse 87   Resp 18   SpO2 97     Last Pain:  Vitals:   10/05/23 1138  TempSrc: Oral  PainSc: 0-No pain         Complications: There were no known notable events for this encounter.

## 2023-10-05 NOTE — Progress Notes (Signed)
 Patient walked to the bathroom without difficulties. Right groin level 0, clean, dry, and intact.

## 2023-10-05 NOTE — Anesthesia Procedure Notes (Signed)
Procedure Name: Intubation Date/Time: 10/05/2023 1:23 PM  Performed by: April Holding, CRNAPre-anesthesia Checklist: Patient identified, Emergency Drugs available, Suction available and Patient being monitored Patient Re-evaluated:Patient Re-evaluated prior to induction Oxygen Delivery Method: Circle System Utilized Preoxygenation: Pre-oxygenation with 100% oxygen Induction Type: IV induction Ventilation: Mask ventilation without difficulty Laryngoscope Size: Miller and 2 Grade View: Grade I Tube type: Oral Tube size: 7.0 mm Number of attempts: 1 Airway Equipment and Method: Stylet and Bite block Placement Confirmation: ETT inserted through vocal cords under direct vision, positive ETCO2 and breath sounds checked- equal and bilateral Secured at: 22 cm Tube secured with: Tape Dental Injury: Teeth and Oropharynx as per pre-operative assessment

## 2023-10-05 NOTE — Discharge Instructions (Signed)

## 2023-10-06 ENCOUNTER — Encounter (HOSPITAL_COMMUNITY): Payer: Self-pay | Admitting: Cardiovascular Disease

## 2023-10-06 MED FILL — Fentanyl Citrate Preservative Free (PF) Inj 100 MCG/2ML: INTRAMUSCULAR | Qty: 2 | Status: AC

## 2023-10-06 NOTE — Anesthesia Postprocedure Evaluation (Signed)
Anesthesia Post Note  Patient: Renee Klein  Procedure(s) Performed: A-FLUTTER ABLATION     Patient location during evaluation: PACU Anesthesia Type: General Level of consciousness: awake and alert Pain management: pain level controlled Vital Signs Assessment: post-procedure vital signs reviewed and stable Respiratory status: spontaneous breathing, nonlabored ventilation, respiratory function stable and patient connected to nasal cannula oxygen Cardiovascular status: blood pressure returned to baseline and stable Postop Assessment: no apparent nausea or vomiting Anesthetic complications: no   There were no known notable events for this encounter.  Last Vitals:  Vitals:   10/05/23 1800 10/05/23 1900  BP: (!) 145/89 (!) 152/82  Pulse: 69 76  Resp: 13 (!) 24  Temp:    SpO2: 91% 90%    Last Pain:  Vitals:   10/05/23 1625  TempSrc:   PainSc: 0-No pain   Pain Goal:                   Collene Schlichter

## 2023-10-12 ENCOUNTER — Other Ambulatory Visit (HOSPITAL_COMMUNITY): Payer: Self-pay

## 2023-10-12 ENCOUNTER — Telehealth: Payer: Self-pay

## 2023-10-12 NOTE — Telephone Encounter (Signed)
Pharmacy Patient Advocate Encounter   Received notification from CoverMyMeds that prior authorization for ELIQUIS is required/requested.   Insurance verification completed.   The patient is insured through [employer] .   Per test claim: PA required; PA submitted to above mentioned insurance via CoverMyMeds Key/confirmation #/EOC [id number] Status is pending

## 2023-10-12 NOTE — Telephone Encounter (Signed)
Pharmacy Patient Advocate Encounter  Received notification from [employer] that Prior Authorization for Renee Klein has been APPROVED from 10/12/23 to 10/11/24. Ran test claim, Copay is $35. This test claim was processed through [facility]- copay amounts may vary at other pharmacies due to pharmacy/plan contracts, or as the patient moves through the different stages of their insurance plan.

## 2023-10-17 ENCOUNTER — Emergency Department (HOSPITAL_COMMUNITY)
Admission: EM | Admit: 2023-10-17 | Discharge: 2023-10-17 | Disposition: A | Discharge status: Home / Self Care | Payer: Medicare Other | Attending: Student | Admitting: Student

## 2023-10-17 ENCOUNTER — Encounter (HOSPITAL_COMMUNITY): Payer: Self-pay | Admitting: Emergency Medicine

## 2023-10-17 ENCOUNTER — Other Ambulatory Visit: Payer: Self-pay

## 2023-10-17 ENCOUNTER — Emergency Department (HOSPITAL_COMMUNITY): Payer: Medicare Other

## 2023-10-17 DIAGNOSIS — R0789 Other chest pain: Secondary | ICD-10-CM | POA: Insufficient documentation

## 2023-10-17 DIAGNOSIS — J45909 Unspecified asthma, uncomplicated: Secondary | ICD-10-CM | POA: Diagnosis not present

## 2023-10-17 DIAGNOSIS — R002 Palpitations: Secondary | ICD-10-CM | POA: Insufficient documentation

## 2023-10-17 DIAGNOSIS — Z96652 Presence of left artificial knee joint: Secondary | ICD-10-CM | POA: Insufficient documentation

## 2023-10-17 DIAGNOSIS — R944 Abnormal results of kidney function studies: Secondary | ICD-10-CM | POA: Insufficient documentation

## 2023-10-17 DIAGNOSIS — Z79899 Other long term (current) drug therapy: Secondary | ICD-10-CM | POA: Insufficient documentation

## 2023-10-17 DIAGNOSIS — Z7901 Long term (current) use of anticoagulants: Secondary | ICD-10-CM | POA: Insufficient documentation

## 2023-10-17 LAB — BASIC METABOLIC PANEL
Anion gap: 9 (ref 5–15)
BUN: 13 mg/dL (ref 8–23)
CO2: 24 mmol/L (ref 22–32)
Calcium: 8.6 mg/dL — ABNORMAL LOW (ref 8.9–10.3)
Chloride: 106 mmol/L (ref 98–111)
Creatinine, Ser: 1.14 mg/dL — ABNORMAL HIGH (ref 0.44–1.00)
GFR, Estimated: 53 mL/min — ABNORMAL LOW (ref >60)
Glucose, Bld: 110 mg/dL — ABNORMAL HIGH (ref 70–99)
Potassium: 3.7 mmol/L (ref 3.5–5.1)
Sodium: 139 mmol/L (ref 135–145)

## 2023-10-17 LAB — CBC
HCT: 38.4 % (ref 36.0–46.0)
Hemoglobin: 13 g/dL (ref 12.0–15.0)
MCH: 31.3 pg (ref 26.0–34.0)
MCHC: 33.9 g/dL (ref 30.0–36.0)
MCV: 92.5 fL (ref 80.0–100.0)
Platelets: 196 10*3/uL (ref 150–400)
RBC: 4.15 MIL/uL (ref 3.87–5.11)
RDW: 12.4 % (ref 11.5–15.5)
WBC: 7.1 10*3/uL (ref 4.0–10.5)
nRBC: 0 % (ref 0.0–0.2)

## 2023-10-17 LAB — TROPONIN I (HIGH SENSITIVITY): Troponin I (High Sensitivity): 9 ng/L (ref <18)

## 2023-10-17 NOTE — ED Triage Notes (Signed)
Patient brought in by [facility] from home. Patient stated she was having some sharp chest pain that started on the left side that radiated to her left face, neck and arm. Patient stated she could feel her heart palpated and checked her heart rate and it was 158. Patient took 324mg  of her own aspirin. Patient has a history of an ablation 2 weeks ago. Patient was started on Eliquis and Metoprolol. With EMS patient was Sinus rhythm and heart rate was 80's and patient had no c/o pain.

## 2023-10-18 NOTE — ED Provider Notes (Signed)
[place] EMERGENCY DEPARTMENT AT [facility] Provider Note  CSN: [id number] Arrival date & time: 10/17/23 2042  Chief Complaint(s) Chest Pain  HPI Renee Klein is a 68 y.o. female with PMH a flutter status post cardioversion on Eliquis and Lopressor who presents emerged department for evaluation of chest pain and palpitations.  Patient arrives via EMS stating that she had sudden onset sharp left-sided chest pain with radiation to her neck that started tonight.  This occurred at the same time as a feeling of palpitations and she took her pulse and found to be in the 150s.  She took 324 of aspirin at home and this sensation subsequently aborted and on EMS arrival patient had returned to normal sinus rhythm with complete resolution of her pain.  Here in the Emergency Department she continues to be symptom-free with no chest pain, shortness of breath, abdominal pain, nausea, vomiting or other systemic symptoms.   Past Medical History Past Medical History:  Diagnosis Date   Arthritis    Asthma    Concussion    Depression    Endometriosis    GERD (gastroesophageal reflux disease)    Heart murmur    Hypercholesterolemia    Patient Active Problem List   Diagnosis Date Noted   Acute medial meniscus tear, left, subsequent encounter 04/10/2020   Primary osteoarthritis of right knee 04/07/2019   Primary osteoarthritis of left knee 04/07/2019   Calculus of gallbladder with acute cholecystitis without obstruction    Home Medication(s) Prior to Admission medications   Medication Sig Start Date End Date Taking? Authorizing Provider  albuterol (PROVENTIL HFA;VENTOLIN HFA) 108 (90 BASE) MCG/ACT inhaler Inhale 2 puffs into the lungs every 6 (six) hours as needed (Asthma).    [provider]  apixaban (ELIQUIS) 5 MG TABS tablet Take 1 tablet (5 mg total) by mouth 2 (two) times daily. 06/02/23   Antoine Poche, MD  ARNUITY ELLIPTA 200 MCG/ACT AEPB Inhale 1 puff into the lungs  daily. 11/20/16   [provider]  colchicine 0.6 MG tablet Take 1 tablet (0.6 mg total) by mouth 2 (two) times daily as needed (for chest pain). 10/05/23 10/04/24  Mealor, Roberts Gaudy, MD  dimenhyDRINATE (DRAMAMINE) 50 MG tablet Take 50 mg by mouth every 8 (eight) hours as needed (Motion sickness).    [provider]  lisinopril (ZESTRIL) 10 MG tablet Take 10 mg by mouth daily. 07/11/20   [provider]  metoprolol tartrate (LOPRESSOR) 25 MG tablet Take 0.5 tablets (12.5 mg total) by mouth 2 (two) times daily. 06/02/23 10/05/23  Antoine Poche, MD  montelukast (SINGULAIR) 10 MG tablet Take 10 mg by mouth daily.     [provider]  Multiple Vitamins-Minerals (WOMENS MULTIVITAMIN PO) Take 2 tablets by mouth daily. Alive gummy    [provider]  pantoprazole (PROTONIX) 20 MG tablet Take 20 mg by mouth every morning.    [provider]  PARoxetine (PAXIL) 20 MG tablet Take 20 mg by mouth every morning.     [provider]  Propylene Glycol (SYSTANE BALANCE) 0.6 % SOLN Place 1-2 drops into both eyes 3 (three) times daily as needed (dry eyes).    [provider]  Past Surgical History Past Surgical History:  Procedure Laterality Date   A-FLUTTER ABLATION N/A 10/05/2023   Procedure: A-FLUTTER ABLATION;  Surgeon: Mealor, Roberts Gaudy, MD;  Location: [facility];  Service: Cardiovascular;  Laterality: N/A;   ABDOMINAL HYSTERECTOMY     bone scraping left thumb     BUNIONECTOMY Bilateral    CHOLECYSTECTOMY N/A 06/01/2017   Procedure: LAPAROSCOPIC CHOLECYSTECTOMY;  Surgeon: Franky Macho, MD;  Location: [facility];  Service: General;  Laterality: N/A;   DIAGNOSTIC LAPAROSCOPY     endometriosis   KNEE ARTHROPLASTY Left    THUMB AMPUTATION Left    Scrape bone secondary to infection   TONSILLECTOMY      Family History Family History  Problem Relation Age of Onset   Heart attack Father    Other Father        heart valve surgery   Heart attack Mother    Seizures Sister    Breast cancer Sister    Hypertension Sister     Social History Social History   Tobacco Use   Smoking status: Never   Smokeless tobacco: Never  Vaping Use   Vaping status: Never Used  Substance Use Topics   Alcohol use: Yes    Alcohol/week: 0.0 - 1.0 standard drinks of alcohol    Comment: rarely drinks alcohol   Drug use: No   Allergies Codeine, Levofloxacin, Lipitor [atorvastatin calcium], and Statins  Review of Systems Review of Systems  Cardiovascular:  Positive for chest pain.    Physical Exam Vital Signs  I have reviewed the triage vital signs BP (!) 111/57   Pulse 66   Temp 98.4 F (36.9 C) (Oral)   Resp 17   Ht 5\' 5"  (1.651 m)   Wt 120.2 kg   SpO2 97%   BMI 44.10 kg/m   Physical Exam Vitals and nursing note reviewed.  Constitutional:      General: She is not in acute distress.    Appearance: She is well-developed.  HENT:     Head: Normocephalic and atraumatic.  Eyes:     Conjunctiva/sclera: Conjunctivae normal.  Cardiovascular:     Rate and Rhythm: Normal rate and regular rhythm.     Heart sounds: No murmur heard. Pulmonary:     Effort: Pulmonary effort is normal. No respiratory distress.     Breath sounds: Normal breath sounds.  Abdominal:     Palpations: Abdomen is soft.     Tenderness: There is no abdominal tenderness.  Musculoskeletal:        General: No swelling.     Cervical back: Neck supple.  Skin:    General: Skin is warm and dry.     Capillary Refill: Capillary refill takes less than 2 seconds.  Neurological:     Mental Status: She is alert.  Psychiatric:        Mood and Affect: Mood normal.     ED Results and Treatments Labs (all labs ordered are listed, but only abnormal results are displayed) Labs Reviewed  BASIC METABOLIC PANEL - Abnormal;  Notable for the following components:      Result Value   Glucose, Bld 110 (*)    Creatinine, Ser 1.14 (*)    Calcium 8.6 (*)    GFR, Estimated 53 (*)    All other components within normal limits  CBC  TROPONIN I (HIGH SENSITIVITY)  TROPONIN I (HIGH SENSITIVITY)  Radiology DG Chest Port 1 View Result Date: 10/17/2023 CLINICAL DATA:  Chest pain EXAM: PORTABLE CHEST 1 VIEW COMPARISON:  06/01/2023 FINDINGS: Stable cardiomediastinal silhouette. No focal consolidation, pleural effusion, or pneumothorax. No displaced rib fractures. IMPRESSION: No active disease. Electronically Signed   By: Minerva Fester M.D.   On: 10/17/2023 22:31    Pertinent labs & imaging results that were available during my care of the patient were reviewed by me and considered in my medical decision making (see MDM for details).  Medications Ordered in ED Medications - No data to display                                                                                                                                   Procedures Procedures  (including critical care time)  Medical Decision Making / ED Course   This patient presents to the ED for concern of chest pain, palpitations, this involves an extensive number of treatment options, and is a complaint that carries with it a high risk of complications and morbidity.  The differential diagnosis includes atrial flutter, arrhythmia, electrolyte abnormality, ACS, Aortic Dissection, Pneumothorax, Pneumonia, Esophageal Rupture, PE, Tamponade/Pericardial Effusion, pericarditis, esophageal spasm, dysrhythmia, GERD, costochondritis.  MDM: Patient seen emergency room for evaluation of chest pain and palpitations.  Physical exam is unremarkable with a normal cardiopulmonary exam.  Laboratory evaluation with a creatinine of 1.14 but is otherwise unremarkable.   High-sensitivity troponin is negative.  Chest x-ray unremarkable.  Patient remains in normal sinus rhythm here on the cardiac monitor and she did not have any return of arrhythmia or atrial flutter in the 2 and half hours she was in the emergency department.  Given patient's known history of atrial flutter, do suspect that she had an episode of transient symptomatic a flutter but self aborted.  Currently does not meet inpatient criteria for admission and will be discharged with cardiology follow-up and return precautions of which she voiced understanding.   Additional history obtained: -Additional history obtained from multiple family members -External records from outside source obtained and reviewed including: Chart review including previous notes, labs, imaging, consultation notes   Lab Tests: -I ordered, reviewed, and interpreted labs.   The pertinent results include:   Labs Reviewed  BASIC METABOLIC PANEL - Abnormal; Notable for the following components:      Result Value   Glucose, Bld 110 (*)    Creatinine, Ser 1.14 (*)    Calcium 8.6 (*)    GFR, Estimated 53 (*)    All other components within normal limits  CBC  TROPONIN I (HIGH SENSITIVITY)  TROPONIN I (HIGH SENSITIVITY)         Imaging Studies ordered: I ordered imaging studies including chest x-ray I independently visualized and interpreted imaging. I agree with the radiologist interpretation   Medicines ordered and prescription drug management: No orders of the defined types were placed in this encounter.   -I  have reviewed the patients home medicines and have made adjustments as needed  Critical interventions none    Cardiac Monitoring: The patient was maintained on a cardiac monitor.  I personally viewed and interpreted the cardiac monitored which showed an underlying rhythm of: NSR  Social Determinants of Health:  Factors impacting patients care include: none   Reevaluation: After the interventions  noted above, I reevaluated the patient and found that they have :stayed the same  Co morbidities that complicate the patient evaluation  Past Medical History:  Diagnosis Date   Arthritis    Asthma    Concussion    Depression    Endometriosis    GERD (gastroesophageal reflux disease)    Heart murmur    Hypercholesterolemia       Dispostion: I considered admission for this patient, but at this time she does not meet inpatient criteria for admission and will be discharged with outpatient follow-up     Final Clinical Impression(s) / ED Diagnoses Final diagnoses:  Palpitations  Atypical chest pain     @PCDICTATION @    Yakub Lodes, Wyn Forster, MD 10/18/23 [phone]

## 2023-10-19 ENCOUNTER — Telehealth: Payer: Self-pay | Admitting: Cardiovascular Disease

## 2023-10-19 NOTE — Telephone Encounter (Signed)
 Patient was returning call. Please advise ?

## 2023-10-19 NOTE — Telephone Encounter (Signed)
Patient c/o Palpitations:  STAT if patient reporting lightheadedness, shortness of breath, or chest pain  How long have you had palpitations/irregular HR/ Afib? Are you having the symptoms now?   No  Are you currently experiencing lightheadedness, SOB or CP?   No  Do you have a history of afib (atrial fibrillation) or irregular heart rhythm?   yes  Have you checked your BP or HR? (document readings if available): Patient noted her HR got up to 150 on Saturday and she was given a baby aspirin to chew on  Are you experiencing any other symptoms?  Not at this time  Patient stated she had afib symptoms on Saturday (2/1) and went to ER.  Patient wants advices on if she needs to get nitroglycerin or other medication change.

## 2023-10-19 NOTE — Telephone Encounter (Signed)
 Left message for patient to call back

## 2023-10-19 NOTE — Telephone Encounter (Signed)
Called patient back. Patient stated she had left side chest pain that caused her to lose her breath. Patient stated her HR went up to 158  and her BP was high. Patient stated EMS was called and she went to the ED for several hours. Patient stated she feels fine today, but she wanted to let Dr. Nelly Laurence know to see if she needs anything like nitroglycerin or does she need any changes to her medications. Will forward to Dr. Nelly Laurence and his nurse.

## 2023-10-22 NOTE — Telephone Encounter (Signed)
 Spoke with patient, no chest discomfort or shortness of breath since last episode on 10/17/23 that she was seen in the ED for. Patient states she is doing well. Confirmed upcoming follow up visits with Jonelle Neri on 2/17. No needs today

## 2023-11-01 NOTE — Progress Notes (Unsigned)
  Electrophysiology Office Note:   Date:  11/02/2023  ID:  Renee Klein, DOB Jan 13, 1956, MRN [id number]  Primary Cardiologist: None Electrophysiologist: Maurice Small, MD      History of Present Illness:   Renee Klein is a 68 y.o. female with h/o typical atrial flutter s/p ablation seen today for routine electrophysiology followup.   Since last being seen in our clinic the patient reports doing well overall. Did have palpitations and HR in 150s 2/1 and was seen in ED. Was in NSR on arrival and has had no further. Currently, she denies chest pain, palpitations, dyspnea, PND, orthopnea, nausea, vomiting, dizziness, syncope, edema, weight gain, or early satiety.   Review of systems complete and found to be negative unless listed in HPI.   EP Information / Studies Reviewed:    EKG is ordered today. Personal review as below.  EKG Interpretation Date/Time:  Monday November 02 2023 11:54:55 EST Ventricular Rate:  63 PR Interval:  128 QRS Duration:  82 QT Interval:  416 QTC Calculation: 425 R Axis:   8  Text Interpretation: Normal sinus rhythm with sinus arrhythmia Low voltage QRS Confirmed by Maxine Glenn [phone]) on 11/02/2023 12:05:41 PM    Arrhythmia/Device History S/p CTI 10/05/2023   Physical Exam:   VS:  BP 118/70 (BP Location: Left Arm, Patient Position: Sitting, Cuff Size: Large)   Pulse 65   Resp 16   Ht 5\' 5"  (1.651 m)   Wt 270 lb 6.4 oz (122.7 kg)   SpO2 95%   BMI 45.00 kg/m    Wt Readings from Last 3 Encounters:  11/02/23 270 lb 6.4 oz (122.7 kg)  10/17/23 265 lb (120.2 kg)  10/05/23 265 lb (120.2 kg)     GEN: No acute distress NECK: No JVD; No carotid bruits CARDIAC: Regular rate and rhythm, no murmurs, rubs, gallops RESPIRATORY:  Clear to auscultation without rales, wheezing or rhonchi  ABDOMEN: Soft, non-tender, non-distended EXTREMITIES:  No edema; No deformity   ASSESSMENT AND PLAN:    Typical atrial flutter S/p ablation 10/05/2023 Continue  eliquis 5 mg BID for CHA2DS2/VASc of at least 3  Breakthrough of some type 10/17/2023. She understands risk of AF despite flutter ablation, especially given obesity and OSA.  If has further arrhyhtmia, can wear monitor to clarify and plan would depend on if it was further flutter vs AF.  Continue lopressor 12.5 mg BID  OSA  Encouraged nightly CPAP   Obesity Body mass index is 45 kg/m.  Encouraged lifestyle modification    Follow up with Dr. Nelly Laurence in 6 months. Sooner with more breakthrough arrhyhtmia.   Signed, Graciella Freer, PA-C

## 2023-11-02 ENCOUNTER — Encounter: Payer: Self-pay | Admitting: Student

## 2023-11-02 ENCOUNTER — Ambulatory Visit: Payer: Managed Care, Other (non HMO) | Attending: Student | Admitting: Student

## 2023-11-02 VITALS — BP 118/70 | HR 65 | Resp 16 | Ht 65.0 in | Wt 270.4 lb

## 2023-11-02 DIAGNOSIS — G4733 Obstructive sleep apnea (adult) (pediatric): Secondary | ICD-10-CM | POA: Diagnosis not present

## 2023-11-02 DIAGNOSIS — I4892 Unspecified atrial flutter: Secondary | ICD-10-CM | POA: Diagnosis not present

## 2023-11-02 MED ORDER — METOPROLOL TARTRATE 25 MG PO TABS: 12.5000 mg | ORAL_TABLET | Freq: Two times a day (BID) | ORAL | 3 refills | Status: AC

## 2023-11-02 NOTE — Patient Instructions (Signed)
Medication Instructions:  Your physician recommends that you continue on your current medications as directed. Please refer to the Current Medication list given to you today.  *If you need a refill on your cardiac medications before your next appointment, please call your pharmacy*  Lab Work: None ordered If you have labs (blood work) drawn today and your tests are completely normal, you will receive your results only by: MyChart Message (if you have MyChart) OR A paper copy in the mail If you have any lab test that is abnormal or we need to change your treatment, we will call you to review the results.  Follow-Up: At [facility], you and your health needs are our priority.  As part of our continuing mission to provide you with exceptional heart care, we have created designated Provider Care Teams.  These Care Teams include your primary Cardiologist (physician) and Advanced Practice Providers (APPs -  Physician Assistants and Nurse Practitioners) who all work together to provide you with the care you need, when you need it.  Your next appointment:   6 month(s)  Provider:   Augustus Mealor, MD  

## 2023-11-16 ENCOUNTER — Ambulatory Visit: Payer: Managed Care, Other (non HMO) | Attending: Cardiology | Admitting: Cardiology

## 2023-11-16 ENCOUNTER — Encounter: Payer: Self-pay | Admitting: Cardiology

## 2023-11-16 VITALS — BP 140/90 | HR 74 | Ht 65.0 in | Wt 268.4 lb

## 2023-11-16 DIAGNOSIS — I4892 Unspecified atrial flutter: Secondary | ICD-10-CM | POA: Diagnosis not present

## 2023-11-16 DIAGNOSIS — I10 Essential (primary) hypertension: Secondary | ICD-10-CM

## 2023-11-16 NOTE — Patient Instructions (Signed)
 Medication Instructions:   Continue all current medications.   Labwork:  none  Testing/Procedures:  none  Follow-Up:  6 months   Any Other Special Instructions Will Be Listed Below (If Applicable).  Please update the office on Friday with your BP readings.   If you need a refill on your cardiac medications before your next appointment, please call your pharmacy.

## 2023-11-16 NOTE — Progress Notes (Signed)
 Clinical Summary Renee Klein is a 68 y.o.female seen today for follow up of the following medical problems.   1.Aflutter - ER visit yesterday with chest pain and headache - in ER tachycardic rates to 150s   - in ER notes there was some concern of preexcitation based on EKGs. AV nodal agents were avoided, procainamide was not readily available and thus proceeded with electrical cardioversion - post conversion EKG showed NSR, normal PR wave without preexcitation - discharged with home monitor - started on eliquis 5mg  bid, lopressor 25mg  bid   -s/p aflutter ablation Jan 2025  - ER visit 10/17/23 with palpitations, checked home HRs and in 150s. Self resolved. EKG showed NSR - no specific symptoms since - no bleeding on eliquis.     2. OSA - compliant with cpap, followed in [place] by pulmonary Dr Katrinka Blazing   3. HTN - home bp's 120s/70s - compliant with meds, but has not taken yet.     Past Medical History:  Diagnosis Date   Arthritis    Asthma    Concussion    Depression    Endometriosis    GERD (gastroesophageal reflux disease)    Heart murmur    Hypercholesterolemia      Allergies  Allergen Reactions   Codeine Nausea And Vomiting   Levofloxacin Nausea And Vomiting   Lipitor [Atorvastatin Calcium] Other (See Comments)    Muscle weakness   Statins Other (See Comments)    Muscle weakness     Current Outpatient Medications  Medication Sig Dispense Refill   albuterol (PROVENTIL HFA;VENTOLIN HFA) 108 (90 BASE) MCG/ACT inhaler Inhale 2 puffs into the lungs every 6 (six) hours as needed (Asthma).     apixaban (ELIQUIS) 5 MG TABS tablet Take 1 tablet (5 mg total) by mouth 2 (two) times daily. 180 tablet 1   colchicine 0.6 MG tablet Take 1 tablet (0.6 mg total) by mouth 2 (two) times daily as needed (for chest pain). 20 tablet 0   dimenhyDRINATE (DRAMAMINE) 50 MG tablet Take 50 mg by mouth every 8 (eight) hours as needed (Motion sickness).     lisinopril (ZESTRIL) 10  MG tablet Take 10 mg by mouth daily.     metoprolol tartrate (LOPRESSOR) 25 MG tablet Take 0.5 tablets (12.5 mg total) by mouth 2 (two) times daily. 90 tablet 3   montelukast (SINGULAIR) 10 MG tablet Take 10 mg by mouth daily.      Multiple Vitamins-Minerals (WOMENS MULTIVITAMIN PO) Take 2 tablets by mouth daily. Alive gummy     pantoprazole (PROTONIX) 20 MG tablet Take 20 mg by mouth every morning.     PARoxetine (PAXIL) 20 MG tablet Take 20 mg by mouth every morning.      Propylene Glycol (SYSTANE BALANCE) 0.6 % SOLN Place 1-2 drops into both eyes 3 (three) times daily as needed (dry eyes).     TRELEGY ELLIPTA 200-62.5-25 MCG/ACT AEPB Inhale 1 puff into the lungs daily.     ARNUITY ELLIPTA 200 MCG/ACT AEPB Inhale 1 puff into the lungs daily. (Patient not taking: Reported on 11/16/2023)     No current facility-administered medications for this visit.     Past Surgical History:  Procedure Laterality Date   A-FLUTTER ABLATION N/A 10/05/2023   Procedure: A-FLUTTER ABLATION;  Surgeon: Mealor, Roberts Gaudy, MD;  Location: [facility];  Service: Cardiovascular;  Laterality: N/A;   ABDOMINAL HYSTERECTOMY     bone scraping left thumb     BUNIONECTOMY Bilateral  CHOLECYSTECTOMY N/A 06/01/2017   Procedure: LAPAROSCOPIC CHOLECYSTECTOMY;  Surgeon: Franky Macho, MD;  Location: [facility];  Service: General;  Laterality: N/A;   DIAGNOSTIC LAPAROSCOPY     endometriosis   KNEE ARTHROPLASTY Left    THUMB AMPUTATION Left    Scrape bone secondary to infection   TONSILLECTOMY       Allergies  Allergen Reactions   Codeine Nausea And Vomiting   Levofloxacin Nausea And Vomiting   Lipitor [Atorvastatin Calcium] Other (See Comments)    Muscle weakness   Statins Other (See Comments)    Muscle weakness      Family History  Problem Relation Age of Onset   Heart attack Father    Other Father        heart valve surgery   Heart attack Mother    Seizures Sister    Breast cancer Sister     Hypertension Sister      Social History Ms. Bonelli reports that she has never smoked. She has never used smokeless tobacco. Ms. Sassaman reports current alcohol use.    Physical Examination Vitals:   11/16/23 0859 11/16/23 0929  BP: (!) 140/88 (!) 140/90  Pulse: 74   SpO2: 95%    Filed Weights   11/16/23 0859  Weight: 268 lb 6.4 oz (121.7 kg)    Gen: resting comfortably, no acute distress HEENT: no scleral icterus, pupils equal round and reactive, no palptable cervical adenopathy,  CV: RRR, no mrg, no jvd Resp: Clear to auscultation bilaterally GI: abdomen is soft, non-tender, non-distended, normal bowel sounds, no hepatosplenomegaly MSK: extremities are warm, no edema.  Skin: warm, no rash Neuro:  no focal deficits Psych: appropriate affect   Diagnostic Studies     Assessment and Plan  1.Aflutter - s/p ablation, isolated episode of palpitations and tachycardia that self terminated - monitor at this time, if reoccuring would plan for outpatient monitor - continue current meds including eliquis for stroke prevention  2. HTN - elevated here but home numbers at goal, has not taken meds yet today - update [place] on home bp's one week   F/u 6 months      Antoine Poche, M.D.

## 2023-11-23 ENCOUNTER — Encounter: Payer: Self-pay | Admitting: Cardiology

## 2023-12-15 ENCOUNTER — Other Ambulatory Visit: Payer: Self-pay | Admitting: *Deleted

## 2023-12-15 MED ORDER — LISINOPRIL 10 MG PO TABS: 20.0000 mg | ORAL_TABLET | Freq: Every day | ORAL | Status: DC

## 2023-12-15 NOTE — Telephone Encounter (Signed)
 Sorry for [employer], BP's little too high, clarify taking lisinopril 10mg  daily if so please increase to 20mg  daily and update [place] on home bp's in 2 weeks, checking 3 times a week or so would be fine, does not have to be daily  Dominga Ferry MD

## 2024-01-06 ENCOUNTER — Telehealth: Payer: Self-pay | Admitting: Cardiology

## 2024-01-06 MED ORDER — LISINOPRIL 10 MG PO TABS: 20.0000 mg | ORAL_TABLET | Freq: Every day | ORAL | 3 refills | Status: DC

## 2024-01-06 NOTE — Telephone Encounter (Signed)
*  STAT* If patient is at the pharmacy, call can be transferred to refill team.   1. Which medications need to be refilled? (please list name of each medication and dose if known) lisinopril  (ZESTRIL ) 10 MG tablet   2. Which pharmacy/location (including street and city if local pharmacy) is medication to be sent to? [facility] [address], [place] - [place] [address] Phone: [phone]  Fax: [phone]     3. Do they need a 30 day or 90 day supply? 90

## 2024-01-06 NOTE — Telephone Encounter (Signed)
 Refill complete to [facility]

## 2024-02-04 ENCOUNTER — Other Ambulatory Visit: Payer: Self-pay | Admitting: Cardiology

## 2024-02-04 DIAGNOSIS — I4892 Unspecified atrial flutter: Secondary | ICD-10-CM

## 2024-02-05 NOTE — Telephone Encounter (Signed)
 Eliquis  5mg  refill request received. Patient is 68 years old, weight-121.kg, Crea-1.14 on 10/17/23, Diagnosis-Aflutter, and last seen by Dr. Amanda Jungling on 11/16/23. Dose is appropriate based on dosing criteria. Will send in refill to requested pharmacy.

## 2024-07-06 ENCOUNTER — Encounter: Payer: Self-pay | Admitting: Cardiology

## 2024-07-06 ENCOUNTER — Other Ambulatory Visit (HOSPITAL_BASED_OUTPATIENT_CLINIC_OR_DEPARTMENT_OTHER): Payer: Self-pay

## 2024-07-06 ENCOUNTER — Encounter: Payer: Self-pay | Admitting: *Deleted

## 2024-07-06 ENCOUNTER — Ambulatory Visit: Attending: Cardiology | Admitting: Cardiology

## 2024-07-06 VITALS — BP 132/82 | HR 80 | Ht 64.0 in | Wt 264.6 lb

## 2024-07-06 DIAGNOSIS — I4892 Unspecified atrial flutter: Secondary | ICD-10-CM | POA: Diagnosis not present

## 2024-07-06 DIAGNOSIS — D6869 Other thrombophilia: Secondary | ICD-10-CM | POA: Diagnosis not present

## 2024-07-06 DIAGNOSIS — I10 Essential (primary) hypertension: Secondary | ICD-10-CM

## 2024-07-06 MED ORDER — LISINOPRIL 20 MG PO TABS
20.0000 mg | ORAL_TABLET | Freq: Every day | ORAL | 3 refills | Status: AC
  Filled 2024-07-06: qty 30, 30d supply, fill #0

## 2024-07-06 NOTE — Progress Notes (Signed)
 Clinical Summary Renee Klein is a 68 y.o.female seen today for follow up of the following medical problems.    1.Aflutter - ER visit yesterday with chest pain and headache - in ER tachycardic rates to 150s   - in ER notes there was some concern of preexcitation based on EKGs. AV nodal agents were avoided, procainamide  was not readily available and thus proceeded with electrical cardioversion - post conversion EKG showed NSR, normal PR wave without preexcitation - discharged with home monitor - started on eliquis  5mg  bid, lopressor  25mg  bid   -s/p aflutter ablation Jan 2025  - ER visit 10/17/23 with palpitations, checked home HRs and in 150s. Self resolved. EKG showed NSR  - isolated short episodes of palpitations, overall has done well  - compliant with meds, no bleeding on eliquis        2. OSA - compliant with cpap, followed in [place] by pulmonary Dr Claudene     3. HTN - home bp's uslally 120s-130s/70s-80s - she is compliant with meds, we had recently increased her lisinopril  to 20mg  daily.   4. HLD - recently started on statin by pcp, crestor 10mg  daily.  - we are request pcp labs     Past Medical History:  Diagnosis Date   Arthritis    Asthma    Concussion    Depression    Endometriosis    GERD (gastroesophageal reflux disease)    Heart murmur    Hypercholesterolemia      Allergies  Allergen Reactions   Codeine Nausea And Vomiting   Levofloxacin Nausea And Vomiting   Lipitor [Atorvastatin Calcium] Other (See Comments)    Muscle weakness   Statins Other (See Comments)    Muscle weakness     Current Outpatient Medications  Medication Sig Dispense Refill   albuterol (PROVENTIL HFA;VENTOLIN HFA) 108 (90 BASE) MCG/ACT inhaler Inhale 2 puffs into the lungs every 6 (six) hours as needed (Asthma).     apixaban  (ELIQUIS ) 5 MG TABS tablet Take 1 tablet by mouth twice daily 180 tablet 1   colchicine  0.6 MG tablet Take 1 tablet (0.6 mg total) by mouth 2 (two)  times daily as needed (for chest pain). 20 tablet 0   dimenhyDRINATE (DRAMAMINE) 50 MG tablet Take 50 mg by mouth every 8 (eight) hours as needed (Motion sickness).     lisinopril  (ZESTRIL ) 20 MG tablet Take 1 tablet (20 mg total) by mouth daily. 90 tablet 3   meclizine (ANTIVERT) 50 MG tablet Take 25 mg by mouth 3 (three) times daily as needed.     metoprolol  tartrate (LOPRESSOR ) 25 MG tablet Take 0.5 tablets (12.5 mg total) by mouth 2 (two) times daily. 90 tablet 3   montelukast (SINGULAIR) 10 MG tablet Take 10 mg by mouth daily.      Multiple Vitamins-Minerals (WOMENS MULTIVITAMIN PO) Take 2 tablets by mouth daily. Alive gummy     pantoprazole (PROTONIX) 20 MG tablet Take 20 mg by mouth every morning.     PARoxetine (PAXIL) 20 MG tablet Take 20 mg by mouth every morning.      Propylene Glycol (SYSTANE BALANCE) 0.6 % SOLN Place 1-2 drops into both eyes 3 (three) times daily as needed (dry eyes).     rosuvastatin (CRESTOR) 10 MG tablet Take 10 mg by mouth daily.     TRELEGY ELLIPTA 200-62.5-25 MCG/ACT AEPB Inhale 1 puff into the lungs daily.     acetaminophen  (TYLENOL ) 325 MG tablet Take 650 mg by mouth  every 4 (four) hours as needed.     ARNUITY ELLIPTA 200 MCG/ACT AEPB Inhale 1 puff into the lungs daily. (Patient not taking: Reported on 07/06/2024)     No current facility-administered medications for this visit.     Past Surgical History:  Procedure Laterality Date   A-FLUTTER ABLATION N/A 10/05/2023   Procedure: A-FLUTTER ABLATION;  Surgeon: Mealor, Eulas BRAVO, MD;  Location: [facility];  Service: Cardiovascular;  Laterality: N/A;   ABDOMINAL HYSTERECTOMY     bone scraping left thumb     BUNIONECTOMY Bilateral    CHOLECYSTECTOMY N/A 06/01/2017   Procedure: LAPAROSCOPIC CHOLECYSTECTOMY;  Surgeon: Mavis Anes, MD;  Location: [facility];  Service: General;  Laterality: N/A;   DIAGNOSTIC LAPAROSCOPY     endometriosis   KNEE ARTHROPLASTY Left    THUMB AMPUTATION Left    Scrape bone  secondary to infection   TONSILLECTOMY       Allergies  Allergen Reactions   Codeine Nausea And Vomiting   Levofloxacin Nausea And Vomiting   Lipitor [Atorvastatin Calcium] Other (See Comments)    Muscle weakness   Statins Other (See Comments)    Muscle weakness      Family History  Problem Relation Age of Onset   Heart attack Father    Other Father        heart valve surgery   Heart attack Mother    Seizures Sister    Breast cancer Sister    Hypertension Sister      Social History Renee Klein Klein that she has never smoked. She has never used smokeless tobacco. Renee Klein current alcohol use.    Physical Examination Vitals:   07/06/24 0828 07/06/24 0841  BP: (!) 140/88 132/82  Pulse: 80   SpO2: 96%    Filed Weights   07/06/24 0828  Weight: 264 lb 9.6 oz (120 kg)    Gen: resting comfortably, no acute distress HEENT: no scleral icterus, pupils equal round and reactive, no palptable cervical adenopathy,  CV: RRR, no m/rg, no jvd Resp: Clear to auscultation bilaterally GI: abdomen is soft, non-tender, non-distended, normal bowel sounds, no hepatosplenomegaly MSK: extremities are warm, no edema.  Skin: warm, no rash Neuro:  no focal deficits Psych: appropriate affect     Assessment and Plan   1.Aflutter/acquired thrombophilia - s/p ablation, overall doing well - continue current meds including eliquis  for stroke prevention   2. HTN - at goal, continue current meds  Request pcp labs   F/u 6 months     Renee Klein, M.D.

## 2024-07-06 NOTE — Patient Instructions (Addendum)

## 2024-10-14 ENCOUNTER — Other Ambulatory Visit (HOSPITAL_BASED_OUTPATIENT_CLINIC_OR_DEPARTMENT_OTHER): Payer: Self-pay

## 2024-10-18 ENCOUNTER — Other Ambulatory Visit: Payer: Self-pay | Admitting: Cardiology

## 2025-01-09 ENCOUNTER — Ambulatory Visit: Admitting: Cardiology
